# Patient Record
Sex: Female | Born: 1980 | Race: White | Hispanic: No | Marital: Married | Smoking: Never smoker
Health system: Southern US, Community
[De-identification: ages and names within clinical notes are randomized; demographics above are authoritative.]

## PROBLEM LIST (undated history)

## (undated) DIAGNOSIS — I7300 Raynaud's syndrome without gangrene: Secondary | ICD-10-CM

## (undated) DIAGNOSIS — G43909 Migraine, unspecified, not intractable, without status migrainosus: Secondary | ICD-10-CM

## (undated) HISTORY — DX: Migraine, unspecified, not intractable, without status migrainosus: G43.909

## (undated) HISTORY — PX: CHOLECYSTECTOMY: SHX55

## (undated) HISTORY — PX: CERVICAL SPINE SURGERY: SHX589

## (undated) HISTORY — DX: Raynaud's syndrome without gangrene: I73.00

## (undated) HISTORY — PX: OTHER SURGICAL HISTORY: SHX169

---

## 2017-04-04 ENCOUNTER — Other Ambulatory Visit: Payer: Self-pay | Admitting: Gastroenterology

## 2017-04-04 DIAGNOSIS — R1011 Right upper quadrant pain: Secondary | ICD-10-CM

## 2017-04-09 ENCOUNTER — Ambulatory Visit
Admission: RE | Admit: 2017-04-09 | Discharge: 2017-04-09 | Disposition: A | Discharge status: Home / Self Care | Payer: 59 | Source: Ambulatory Visit | Attending: Gastroenterology | Admitting: Gastroenterology

## 2017-04-09 DIAGNOSIS — R1011 Right upper quadrant pain: Secondary | ICD-10-CM

## 2020-04-25 ENCOUNTER — Encounter (INDEPENDENT_AMBULATORY_CARE_PROVIDER_SITE_OTHER): Payer: Self-pay | Admitting: *Deleted

## 2020-07-20 ENCOUNTER — Other Ambulatory Visit (INDEPENDENT_AMBULATORY_CARE_PROVIDER_SITE_OTHER): Payer: Self-pay

## 2020-07-20 ENCOUNTER — Ambulatory Visit (INDEPENDENT_AMBULATORY_CARE_PROVIDER_SITE_OTHER): Payer: Self-pay | Admitting: Gastroenterology

## 2020-07-20 ENCOUNTER — Encounter (INDEPENDENT_AMBULATORY_CARE_PROVIDER_SITE_OTHER): Payer: Self-pay | Admitting: Gastroenterology

## 2020-07-20 ENCOUNTER — Encounter (INDEPENDENT_AMBULATORY_CARE_PROVIDER_SITE_OTHER): Payer: Self-pay

## 2020-07-20 ENCOUNTER — Other Ambulatory Visit: Payer: Self-pay

## 2020-07-20 ENCOUNTER — Telehealth (INDEPENDENT_AMBULATORY_CARE_PROVIDER_SITE_OTHER): Payer: Self-pay

## 2020-07-20 DIAGNOSIS — R112 Nausea with vomiting, unspecified: Secondary | ICD-10-CM | POA: Insufficient documentation

## 2020-07-20 DIAGNOSIS — Z800 Family history of malignant neoplasm of digestive organs: Secondary | ICD-10-CM

## 2020-07-20 DIAGNOSIS — R110 Nausea: Secondary | ICD-10-CM

## 2020-07-20 DIAGNOSIS — R17 Unspecified jaundice: Secondary | ICD-10-CM | POA: Insufficient documentation

## 2020-07-20 DIAGNOSIS — G8929 Other chronic pain: Secondary | ICD-10-CM

## 2020-07-20 DIAGNOSIS — R1010 Upper abdominal pain, unspecified: Secondary | ICD-10-CM | POA: Insufficient documentation

## 2020-07-20 DIAGNOSIS — R1012 Left upper quadrant pain: Secondary | ICD-10-CM | POA: Insufficient documentation

## 2020-07-20 DIAGNOSIS — R1011 Right upper quadrant pain: Secondary | ICD-10-CM

## 2020-07-20 HISTORY — DX: Nausea with vomiting, unspecified: R11.2

## 2020-07-20 MED ORDER — HYOSCYAMINE SULFATE 0.125 MG PO TABS: 0.1250 mg | ORAL_TABLET | Freq: Four times a day (QID) | ORAL | 1 refills | Status: DC | PRN

## 2020-07-20 MED ORDER — PEG 3350-KCL-NA BICARB-NACL 420 G PO SOLR: 4000.0000 mL | ORAL | 0 refills | Status: DC

## 2020-07-20 NOTE — Patient Instructions (Addendum)
Schedule EGD and colonoscopy Start Levsin as needed for abdominal pain Can stop omeprazole and sucralfate for now until EGD is performed, if you have not felt these medications help Schedule MRCP Can take Zofran as needed for nausea/vomiting

## 2020-07-20 NOTE — H&P (View-Only) (Signed)
Amanda Mullen, M.D. Gastroenterology & Hepatology [facility] [address] [place], [place] [address] Primary Care Physician: Gaspar Skeeters, MD [address] [place] [address]  Referring MD: PCP  Chief Complaint: Left upper quadrant abdominal pain, bloating and chronic right upper quadrant abdominal pain.  History of Present Illness: Amanda Mullen is a 40 y.o. female with past medical history migraines, who presents for evaluation of left upper quadrant abdominal pain, bloating and chronic right upper quadrant abdominal pain.  Patient reports that she had pain in the RUQ for a year intermittently since 2014 and had multiple regurgitation episodes.  She is states that due to the symptoms she sought evaluation by gastroenterologist and the surgeon.  She reported that she had "dye test" possibly a HIDA scan and she was found to have decreased function. S he previously underwent a cholecystectomy in 2015 which improved her episode of right upper quadrant pain.  She reported she still has some occasional episodes of pain in this area but is less often than in the past.  She states that the regurgitation episodes actually resolved since then.She reports that she has not been taking acid reflux medication for multiple years after she had the cholecystectomy.   Patient now reports that in November 2021 she had new onset LUQ pain which she describes as severe episodes of stabbing pain in this area.  Has been evaluated by her PCP for this but reports that she has not felt much improvement of her symptoms.  Due to the severity and persistence of her symptoms, she went to the ER at [facility] on 04/19/2020, had CBC and CMP that were only remarkable for elevation of her total bilirubin up to 1.8.  Underwent a CT of the abdomen and pelvis with IV contrast which showed normal findings based on report (images are not available).  Lipase at that  time was 13.  Due to the location of her symptoms, the patient was told that symptoms were possibly related to an ulcer. She had blood workup and was told "the number was consistent with and ulcer". She was given diflucan and omeprazole but this did not improve the symptoms after taking it for a couple of weeks.   She went again to [facility] on 07/17/2020, a repeat CT scan of the abdomen and pelvis with IV contrast was unremarkable.  Labs showed normal CBC, CMP with mildly elevated ALT of 280 and AST was normal 30, total bilirubin was 1.1 alkaline phosphatase was 90, lactic acid was 1.7.  This time her lipase was 140, below 3 times the upper limit of normal.    She reports since the last time she was in the ER she reports she avoid fried and fatty food as it caused recurrent abdominal pain. She reports that she has been having significant abdominal pain. She feels nauseated frequently but does not vomit.  She has frequently bloating episodes, possibly two days of the month she is not bloated.  The symptoms have been chronic in nature.  The patient denies having any fever, chills, hematochezia, melena, hematemesis, diarrhea, jaundice, pruritus or weight loss.  Last EGD: 2015 - in [place], normal per patient Last colonoscopy 2015 - possibly ahd polyps per patient, but no report available.  FHx: neg for any gastrointestinal/liver disease, colon cancer at age 26-40s, possibly esophageal cancer Social: neg smoking, alcohol or illicit drug use Surgical: cholecystectomy, tubal ligation, c-section  Past Medical History: Past Medical History:  Diagnosis Date  .  Migraine     Past Surgical History: Past Surgical History:  Procedure Laterality Date  . CESAREAN SECTION    . CHOLECYSTECTOMY    . tubaligation     Tubes tied 2006    Family History: Family History  Problem Relation Age of Onset  . Ovarian cancer Mother   . Cervical cancer Mother   . Kidney disease Father   . Colon cancer  Father   . Diabetes Father   . Healthy Brother   . Healthy Brother     Social History: Social History   Tobacco Use  Smoking Status Never Smoker  Smokeless Tobacco Never Used   Social History   Substance and Sexual Activity  Alcohol Use Yes   Comment: occasionally   Social History   Substance and Sexual Activity  Drug Use Never    Allergies: Allergies  Allergen Reactions  . Penicillins Other (See Comments)    Patient unsure of  reaction  . Codeine     Caused pain in ribs.  Idelle Jo [Mupirocin] Rash  . Ciprofloxacin Rash    Medications: Current Outpatient Medications  Medication Sig Dispense Refill  . ondansetron (ZOFRAN-ODT) 4 MG disintegrating tablet Take 4 mg by mouth every 8 (eight) hours as needed for nausea or vomiting.    . pantoprazole (PROTONIX) 20 MG tablet Take 20 mg by mouth daily.    . propranolol (INDERAL) 20 MG tablet Take 20 mg by mouth as needed.    . rizatriptan (MAXALT) 10 MG tablet Take 10 mg by mouth as needed for migraine. May repeat in 2 hours if needed    . sucralfate (CARAFATE) 1 g tablet Take 1 g by mouth 4 (four) times daily -  with meals and at bedtime.     No current facility-administered medications for this visit.    Review of Systems: GENERAL: negative for malaise, night sweats HEENT: No changes in hearing or vision, no nose bleeds or other nasal problems. NECK: Negative for lumps, goiter, pain and significant neck swelling RESPIRATORY: Negative for cough, wheezing CARDIOVASCULAR: Negative for chest pain, leg swelling, palpitations, orthopnea GI: SEE HPI MUSCULOSKELETAL: Negative for joint pain or swelling, back pain, and muscle pain. SKIN: Negative for lesions, rash PSYCH: Negative for sleep disturbance, mood disorder and recent psychosocial stressors. HEMATOLOGY Negative for prolonged bleeding, bruising easily, and swollen nodes. ENDOCRINE: Negative for cold or heat intolerance, polyuria, polydipsia and goiter. NEURO:  negative for tremor, gait imbalance, syncope and seizures. The remainder of the review of systems is noncontributory.   Physical Exam: BP 113/81 (BP Location: Left Arm, Patient Position: Sitting, Cuff Size: Large)   Pulse 83   Temp (!) 97.4 F (36.3 C) (Oral)   Ht 5\' 1"  (1.549 m)   Wt 146 lb (66.2 kg)   BMI 27.59 kg/m  GENERAL: The patient is AO x3, in no acute distress. HEENT: Head is normocephalic and atraumatic. EOMI are intact. Mouth is well hydrated and without lesions. NECK: Supple. No masses LUNGS: Clear to auscultation. No presence of rhonchi/wheezing/rales. Adequate chest expansion HEART: RRR, normal s1 and s2. ABDOMEN: mildly tender to palpation in the epigastric and LUQ area, no guarding, no peritoneal signs, and nondistended. BS +. No masses. EXTREMITIES: Without any cyanosis, clubbing, rash, lesions or edema. NEUROLOGIC: AOx3, no focal motor deficit. SKIN: no jaundice, no rashes   Imaging/Labs: as above  I personally reviewed and interpreted the available labs, imaging and endoscopic files.  Impression and Plan: Amanda Mullen is a 39 y.o. female with  past medical history migraines, who presents for evaluation of left upper quadrant abdominal pain, bloating and chronic right upper quadrant abdominal pain. The patient has had chronic symptoms of RUQ pain which is now mild in severity but is now presenting significant pain in the LUQ. She has not presented red flag signs and has not presented any concerning imaging findings patient with 2 CT scans she had recently.  At this point, it would be important to perform an EGD with small bowel biopsies to evaluate Her symptoms as she has persistent with the discomfort.  Since the omeprazole and Carafate have not helped her she can stop these medications for now.  We will prescribe her Levsin as needed to improve her abdominal pain, as well as she will need to continue Zofran as needed to improve the nausea episodes.  The patient had  mild alterations of bilirubin, will obtain an MRCP to rule out any hepatobiliary obstruction, although I consider that her elevated bilirubin could be related to benign etiology such as Gilbert's syndrome.  Finally, she had a strong family history of colon cancer at early age, for which I will proceed with a colonoscopy, she should get colonoscopies every 5 years afterwards at the very least.  - Schedule EGD and colonoscopy - Start Levsin as needed for abdominal pain - Can stop omeprazole and sucralfate for now until EGD is performed, as she has not felt relief of symptoms while on it - Schedule MRCP - Can take Zofran as needed for nausea/vomiting  All questions were answered.      Amanda Blazing, MD Gastroenterology and Hepatology [facility]

## 2020-07-20 NOTE — Telephone Encounter (Signed)
Amanda Mullen, CMA  

## 2020-07-20 NOTE — Progress Notes (Signed)
[name], M.D. Gastroenterology & Hepatology [facility] [address] [place], [place] [address] Primary Care Physician: Gaspar Skeeters, MD [address] [place] [address]  Referring MD: PCP  Chief Complaint: Left upper quadrant abdominal pain, bloating and chronic right upper quadrant abdominal pain.  History of Present Illness: Amanda Mullen is a 40 y.o. female with past medical history migraines, who presents for evaluation of left upper quadrant abdominal pain, bloating and chronic right upper quadrant abdominal pain.  Patient reports that she had pain in the RUQ for a year intermittently since 2014 and had multiple regurgitation episodes.  She is states that due to the symptoms she sought evaluation by gastroenterologist and the surgeon.  She reported that she had "dye test" possibly a HIDA scan and she was found to have decreased function. S he previously underwent a cholecystectomy in 2015 which improved her episode of right upper quadrant pain.  She reported she still has some occasional episodes of pain in this area but is less often than in the past.  She states that the regurgitation episodes actually resolved since then.She reports that she has not been taking acid reflux medication for multiple years after she had the cholecystectomy.   Patient now reports that in November 2021 she had new onset LUQ pain which she describes as severe episodes of stabbing pain in this area.  Has been evaluated by her PCP for this but reports that she has not felt much improvement of her symptoms.  Due to the severity and persistence of her symptoms, she went to the ER at [facility] on 04/19/2020, had CBC and CMP that were only remarkable for elevation of her total bilirubin up to 1.8.  Underwent a CT of the abdomen and pelvis with IV contrast which showed normal findings based on report (images are not available).  Lipase at that  time was 13.  Due to the location of her symptoms, the patient was told that symptoms were possibly related to an ulcer. She had blood workup and was told "the number was consistent with and ulcer". She was given diflucan and omeprazole but this did not improve the symptoms after taking it for a couple of weeks.   She went again to [facility] on 07/17/2020, a repeat CT scan of the abdomen and pelvis with IV contrast was unremarkable.  Labs showed normal CBC, CMP with mildly elevated ALT of 280 and AST was normal 30, total bilirubin was 1.1 alkaline phosphatase was 90, lactic acid was 1.7.  This time her lipase was 140, below 3 times the upper limit of normal.    She reports since the last time she was in the ER she reports she avoid fried and fatty food as it caused recurrent abdominal pain. She reports that she has been having significant abdominal pain. She feels nauseated frequently but does not vomit.  She has frequently bloating episodes, possibly two days of the month she is not bloated.  The symptoms have been chronic in nature.  The patient denies having any fever, chills, hematochezia, melena, hematemesis, diarrhea, jaundice, pruritus or weight loss.  Last EGD: 2015 - in [place], normal per patient Last colonoscopy 2015 - possibly ahd polyps per patient, but no report available.  FHx: neg for any gastrointestinal/liver disease, colon cancer at age 26-40s, possibly esophageal cancer Social: neg smoking, alcohol or illicit drug use Surgical: cholecystectomy, tubal ligation, c-section  Past Medical History: Past Medical History:  Diagnosis Date  .  Migraine     Past Surgical History: Past Surgical History:  Procedure Laterality Date  . CESAREAN SECTION    . CHOLECYSTECTOMY    . tubaligation     Tubes tied 2006    Family History: Family History  Problem Relation Age of Onset  . Ovarian cancer Mother   . Cervical cancer Mother   . Kidney disease Father   . Colon cancer  Father   . Diabetes Father   . Healthy Brother   . Healthy Brother     Social History: Social History   Tobacco Use  Smoking Status Never Smoker  Smokeless Tobacco Never Used   Social History   Substance and Sexual Activity  Alcohol Use Yes   Comment: occasionally   Social History   Substance and Sexual Activity  Drug Use Never    Allergies: Allergies  Allergen Reactions  . Penicillins Other (See Comments)    Patient unsure of  reaction  . Codeine     Caused pain in ribs.  . Bactroban [Mupirocin] Rash  . Ciprofloxacin Rash    Medications: Current Outpatient Medications  Medication Sig Dispense Refill  . ondansetron (ZOFRAN-ODT) 4 MG disintegrating tablet Take 4 mg by mouth every 8 (eight) hours as needed for nausea or vomiting.    . pantoprazole (PROTONIX) 20 MG tablet Take 20 mg by mouth daily.    . propranolol (INDERAL) 20 MG tablet Take 20 mg by mouth as needed.    . rizatriptan (MAXALT) 10 MG tablet Take 10 mg by mouth as needed for migraine. May repeat in 2 hours if needed    . sucralfate (CARAFATE) 1 g tablet Take 1 g by mouth 4 (four) times daily -  with meals and at bedtime.     No current facility-administered medications for this visit.    Review of Systems: GENERAL: negative for malaise, night sweats HEENT: No changes in hearing or vision, no nose bleeds or other nasal problems. NECK: Negative for lumps, goiter, pain and significant neck swelling RESPIRATORY: Negative for cough, wheezing CARDIOVASCULAR: Negative for chest pain, leg swelling, palpitations, orthopnea GI: SEE HPI MUSCULOSKELETAL: Negative for joint pain or swelling, back pain, and muscle pain. SKIN: Negative for lesions, rash PSYCH: Negative for sleep disturbance, mood disorder and recent psychosocial stressors. HEMATOLOGY Negative for prolonged bleeding, bruising easily, and swollen nodes. ENDOCRINE: Negative for cold or heat intolerance, polyuria, polydipsia and goiter. NEURO:  negative for tremor, gait imbalance, syncope and seizures. The remainder of the review of systems is noncontributory.   Physical Exam: BP 113/81 (BP Location: Left Arm, Patient Position: Sitting, Cuff Size: Large)   Pulse 83   Temp (!) 97.4 F (36.3 C) (Oral)   Ht 5' 1" (1.549 m)   Wt 146 lb (66.2 kg)   BMI 27.59 kg/m  GENERAL: The patient is AO x3, in no acute distress. HEENT: Head is normocephalic and atraumatic. EOMI are intact. Mouth is well hydrated and without lesions. NECK: Supple. No masses LUNGS: Clear to auscultation. No presence of rhonchi/wheezing/rales. Adequate chest expansion HEART: RRR, normal s1 and s2. ABDOMEN: mildly tender to palpation in the epigastric and LUQ area, no guarding, no peritoneal signs, and nondistended. BS +. No masses. EXTREMITIES: Without any cyanosis, clubbing, rash, lesions or edema. NEUROLOGIC: AOx3, no focal motor deficit. SKIN: no jaundice, no rashes   Imaging/Labs: as above  I personally reviewed and interpreted the available labs, imaging and endoscopic files.  Impression and Plan: Amanda Mullen is a 39 y.o. female with   past medical history migraines, who presents for evaluation of left upper quadrant abdominal pain, bloating and chronic right upper quadrant abdominal pain. The patient has had chronic symptoms of RUQ pain which is now mild in severity but is now presenting significant pain in the LUQ. She has not presented red flag signs and has not presented any concerning imaging findings patient with 2 CT scans she had recently.  At this point, it would be important to perform an EGD with small bowel biopsies to evaluate Her symptoms as she has persistent with the discomfort.  Since the omeprazole and Carafate have not helped her she can stop these medications for now.  We will prescribe her Levsin as needed to improve her abdominal pain, as well as she will need to continue Zofran as needed to improve the nausea episodes.  The patient had  mild alterations of bilirubin, will obtain an MRCP to rule out any hepatobiliary obstruction, although I consider that her elevated bilirubin could be related to benign etiology such as Gilbert's syndrome.  Finally, she had a strong family history of colon cancer at early age, for which I will proceed with a colonoscopy, she should get colonoscopies every 5 years afterwards at the very least.  - Schedule EGD and colonoscopy - Start Levsin as needed for abdominal pain - Can stop omeprazole and sucralfate for now until EGD is performed, as she has not felt relief of symptoms while on it - Schedule MRCP - Can take Zofran as needed for nausea/vomiting  All questions were answered.      [name], MD Gastroenterology and Hepatology [facility]

## 2020-07-24 ENCOUNTER — Encounter (HOSPITAL_COMMUNITY): Payer: Self-pay | Admitting: Gastroenterology

## 2020-07-24 ENCOUNTER — Other Ambulatory Visit (HOSPITAL_COMMUNITY): Payer: BC Managed Care – PPO

## 2020-07-25 ENCOUNTER — Other Ambulatory Visit (HOSPITAL_COMMUNITY)
Admission: RE | Admit: 2020-07-25 | Discharge: 2020-07-25 | Disposition: A | Discharge status: Home / Self Care | Payer: BC Managed Care – PPO | Source: Ambulatory Visit | Attending: Gastroenterology | Admitting: Gastroenterology

## 2020-07-25 ENCOUNTER — Other Ambulatory Visit: Payer: Self-pay

## 2020-07-25 DIAGNOSIS — Z20822 Contact with and (suspected) exposure to covid-19: Secondary | ICD-10-CM | POA: Insufficient documentation

## 2020-07-25 DIAGNOSIS — Z9049 Acquired absence of other specified parts of digestive tract: Secondary | ICD-10-CM | POA: Diagnosis not present

## 2020-07-25 DIAGNOSIS — Z01812 Encounter for preprocedural laboratory examination: Secondary | ICD-10-CM | POA: Insufficient documentation

## 2020-07-25 DIAGNOSIS — Z800 Family history of malignant neoplasm of digestive organs: Secondary | ICD-10-CM | POA: Diagnosis not present

## 2020-07-25 DIAGNOSIS — Z885 Allergy status to narcotic agent status: Secondary | ICD-10-CM | POA: Diagnosis not present

## 2020-07-25 DIAGNOSIS — R1012 Left upper quadrant pain: Secondary | ICD-10-CM | POA: Diagnosis not present

## 2020-07-25 DIAGNOSIS — Z880 Allergy status to penicillin: Secondary | ICD-10-CM | POA: Diagnosis not present

## 2020-07-25 DIAGNOSIS — Z1211 Encounter for screening for malignant neoplasm of colon: Secondary | ICD-10-CM | POA: Diagnosis present

## 2020-07-25 DIAGNOSIS — Z881 Allergy status to other antibiotic agents status: Secondary | ICD-10-CM | POA: Diagnosis not present

## 2020-07-25 DIAGNOSIS — K2950 Unspecified chronic gastritis without bleeding: Secondary | ICD-10-CM | POA: Diagnosis not present

## 2020-07-25 LAB — SARS CORONAVIRUS 2 (TAT 6-24 HRS): SARS Coronavirus 2: NEGATIVE

## 2020-07-25 LAB — PREGNANCY, URINE: Preg Test, Ur: NEGATIVE

## 2020-07-26 ENCOUNTER — Ambulatory Visit (HOSPITAL_COMMUNITY): Payer: BC Managed Care – PPO | Admitting: Anesthesiology

## 2020-07-26 ENCOUNTER — Encounter (INDEPENDENT_AMBULATORY_CARE_PROVIDER_SITE_OTHER): Payer: Self-pay | Admitting: *Deleted

## 2020-07-26 ENCOUNTER — Encounter (HOSPITAL_COMMUNITY)
Admission: RE | Disposition: A | Discharge status: Home / Self Care | Payer: Self-pay | Source: Home / Self Care | Attending: Gastroenterology

## 2020-07-26 ENCOUNTER — Ambulatory Visit (HOSPITAL_COMMUNITY)
Admission: RE | Admit: 2020-07-26 | Discharge: 2020-07-26 | Disposition: A | Discharge status: Home / Self Care | Payer: BC Managed Care – PPO | Attending: Gastroenterology | Admitting: Gastroenterology

## 2020-07-26 ENCOUNTER — Encounter (HOSPITAL_COMMUNITY): Payer: Self-pay | Admitting: Gastroenterology

## 2020-07-26 ENCOUNTER — Other Ambulatory Visit: Payer: Self-pay

## 2020-07-26 DIAGNOSIS — Z881 Allergy status to other antibiotic agents status: Secondary | ICD-10-CM | POA: Insufficient documentation

## 2020-07-26 DIAGNOSIS — K2950 Unspecified chronic gastritis without bleeding: Secondary | ICD-10-CM | POA: Insufficient documentation

## 2020-07-26 DIAGNOSIS — Z9049 Acquired absence of other specified parts of digestive tract: Secondary | ICD-10-CM | POA: Insufficient documentation

## 2020-07-26 DIAGNOSIS — Z20822 Contact with and (suspected) exposure to covid-19: Secondary | ICD-10-CM | POA: Insufficient documentation

## 2020-07-26 DIAGNOSIS — Z1211 Encounter for screening for malignant neoplasm of colon: Secondary | ICD-10-CM | POA: Diagnosis not present

## 2020-07-26 DIAGNOSIS — Z885 Allergy status to narcotic agent status: Secondary | ICD-10-CM | POA: Insufficient documentation

## 2020-07-26 DIAGNOSIS — Z880 Allergy status to penicillin: Secondary | ICD-10-CM | POA: Insufficient documentation

## 2020-07-26 DIAGNOSIS — Z800 Family history of malignant neoplasm of digestive organs: Secondary | ICD-10-CM | POA: Insufficient documentation

## 2020-07-26 DIAGNOSIS — R1012 Left upper quadrant pain: Secondary | ICD-10-CM | POA: Insufficient documentation

## 2020-07-26 HISTORY — PX: ESOPHAGOGASTRODUODENOSCOPY (EGD) WITH PROPOFOL: SHX5813

## 2020-07-26 HISTORY — PX: BIOPSY: SHX5522

## 2020-07-26 HISTORY — PX: COLONOSCOPY WITH PROPOFOL: SHX5780

## 2020-07-26 LAB — HM COLONOSCOPY

## 2020-07-26 SURGERY — ESOPHAGOGASTRODUODENOSCOPY (EGD) WITH PROPOFOL
Anesthesia: General

## 2020-07-26 MED ORDER — PROPOFOL 10 MG/ML IV BOLUS
INTRAVENOUS | Status: DC | PRN
  Administered 2020-07-26: 150 mg via INTRAVENOUS
  Administered 2020-07-26: 20 mg via INTRAVENOUS

## 2020-07-26 MED ORDER — PROPOFOL 500 MG/50ML IV EMUL
INTRAVENOUS | Status: DC | PRN
  Administered 2020-07-26: 200 ug/kg/min via INTRAVENOUS

## 2020-07-26 MED ORDER — STERILE WATER FOR IRRIGATION IR SOLN
Status: DC | PRN
  Administered 2020-07-26: 100 mL

## 2020-07-26 MED ORDER — LIDOCAINE 2% (20 MG/ML) 5 ML SYRINGE
INTRAMUSCULAR | Status: DC | PRN
  Administered 2020-07-26: 30 mg via INTRAVENOUS

## 2020-07-26 MED ORDER — LACTATED RINGERS IV SOLN
INTRAVENOUS | Status: DC
  Administered 2020-07-26: 1000 mL via INTRAVENOUS

## 2020-07-26 NOTE — Anesthesia Postprocedure Evaluation (Signed)
Anesthesia Post Note  Patient: Chaney Born  Procedure(s) Performed: ESOPHAGOGASTRODUODENOSCOPY (EGD) WITH PROPOFOL (N/A ) COLONOSCOPY WITH PROPOFOL (N/A ) BIOPSY  Patient location during evaluation: Endoscopy Anesthesia Type: General Level of consciousness: awake and alert and oriented Pain management: pain level controlled Vital Signs Assessment: post-procedure vital signs reviewed and stable Respiratory status: spontaneous breathing and respiratory function stable Cardiovascular status: stable Postop Assessment: no apparent nausea or vomiting Anesthetic complications: no   No complications documented.   Last Vitals:  Vitals:   07/26/20 1249 07/26/20 1255  BP: (!) 84/51 (!) 98/59  Pulse: 76 79  Resp: 19 (!) 21  Temp: 36.5 C   SpO2: 98% 98%    Last Pain:  Vitals:   07/26/20 1255  TempSrc:   PainSc: 0-No pain                 Ricki Vanhandel C Worthington Cruzan

## 2020-07-26 NOTE — Interval H&P Note (Signed)
History and Physical Interval Note:  07/26/2020 10:59 AM Amanda Mullen is a 40 y.o. female with past medical history migraines, who presents for evaluation of left upper quadrant abdominal pain, bloating, chronic right upper quadrant abdominal pain and family history of colorectal cancer. Patient states that she is still having some occasional episodes of left lower quadrant pain but she has avoided eating too much as this can lead to worsening symptoms.  Has felt slightly nauseated.  Denies any melena, hematochezia, vomiting, fever, chills.  Last colonoscopy was performed in 2015.  Had some polyps at that time.  Family history is remarkable for father with colon cancer in his 30s and 76s.  HR 64 RR 14 O2 sat 98% BP 121/81 GENERAL: The patient is AO x3, in no acute distress. HEENT: Head is normocephalic and atraumatic. EOMI are intact. Mouth is well hydrated and without lesions. NECK: Supple. No masses LUNGS: Clear to auscultation. No presence of rhonchi/wheezing/rales. Adequate chest expansion HEART: RRR, normal s1 and s2. ABDOMEN: mildly tender in upper abdomen, no guarding, no peritoneal signs, and nondistended. BS +. No masses. EXTREMITIES: Without any cyanosis, clubbing, rash, lesions or edema. NEUROLOGIC: AOx3, no focal motor deficit. SKIN: no jaundice, no rashes   Amanda Mullen  has presented today for surgery, with the diagnosis of Family Hx of colon ca Left Upper Quad abdominal Pain.  The various methods of treatment have been discussed with the patient and family. After consideration of risks, benefits and other options for treatment, the patient has consented to  Procedure(s) with comments: ESOPHAGOGASTRODUODENOSCOPY (EGD) WITH PROPOFOL (N/A) - PM COLONOSCOPY WITH PROPOFOL (N/A) as a surgical intervention.  The patient's history has been reviewed, patient examined, no change in status, stable for surgery.  I have reviewed the patient's chart and labs.  Questions were answered to the  patient's satisfaction.     [name] Mayorga

## 2020-07-26 NOTE — Op Note (Signed)
[facility] Patient Name: Amanda Mullen Procedure Date: 07/26/2020 12:23 PM MRN: [id number] Date of Birth: [birth year] Attending MD: [name] ,  CSN: [id number] Age: 40 Admit Type: Outpatient Procedure:                Colonoscopy Indications:              Screening in patient at increased risk: Family                            history of 1st-degree relative with colorectal                            cancer before age 66 years Providers:                [name], Edrick Kins, RN, Crystal Page Referring MD:              Medicines:                Monitored Anesthesia Care Complications:            No immediate complications. Estimated Blood Loss:     Estimated blood loss: none. Procedure:                Pre-Anesthesia Assessment:                           - Prior to the procedure, a History and Physical                            was performed, and patient medications, allergies                            and sensitivities were reviewed. The patient's                            tolerance of previous anesthesia was reviewed.                           - The risks and benefits of the procedure and the                            sedation options and risks were discussed with the                            patient. All questions were answered and informed                            consent was obtained.                           - ASA Grade Assessment: II - A patient with mild                            systemic disease.                           After obtaining informed consent, the  colonoscope                            was passed under direct vision. Throughout the                            procedure, the patient's blood pressure, pulse, and                            oxygen saturations were monitored continuously. The                            [id number]-HQ190L ([id number]) scope was introduced through                            the anus and advanced to the the cecum,  identified                            by appendiceal orifice and ileocecal valve. The                            colonoscopy was performed without difficulty. The                            patient tolerated the procedure well. The quality                            of the bowel preparation was good. Scope In: 12:25:35 PM Scope Out: 12:46:36 PM Scope Withdrawal Time: 0 hours 14 minutes 14 seconds  Total Procedure Duration: 0 hours 21 minutes 1 second  Findings:      The perianal and digital rectal examinations were normal.      The terminal ileum appeared normal.      The colon (entire examined portion) appeared normal.      The retroflexed view of the distal rectum and anal verge was normal and       showed no anal or rectal abnormalities. Impression:               - The examined portion of the ileum was normal.                           - The entire examined colon is normal.                           - The distal rectum and anal verge are normal on                            retroflexion view.                           - No specimens collected. Moderate Sedation:      Per Anesthesia Care Recommendation:           - Discharge patient to home (ambulatory).                           -  Resume previous diet.                           - Repeat colonoscopy in 5 years for screening                            purposes. Procedure Code(s):        --- Professional ---                           Y1950, Colorectal cancer screening; colonoscopy on                            individual at high risk Diagnosis Code(s):        --- Professional ---                           Z80.0, Family history of malignant neoplasm of                            digestive organs CPT copyright 2019 American Medical Association. All rights reserved. The codes documented in this report are preliminary and upon coder review may  be revised to meet current compliance requirements. [name], MD [name],   07/26/2020 12:52:07 PM This report has been signed electronically. Number of Addenda: 0

## 2020-07-26 NOTE — Discharge Instructions (Signed)
You are being discharged to home. Start a low FODMAP diet - can ask for a copy at the GI clinic  We are waiting for your pathology results.  Start Levsin for abdominal pain episodes as needed Your physician has recommended a repeat colonoscopy in five years for screening purposes.       Colonoscopy, Adult, Care After This sheet gives you information about how to care for yourself after your procedure. Your doctor may also give you more specific instructions. If you have problems or questions, call your doctor. What can I expect after the procedure? After the procedure, it is common to have:  A small amount of blood in your poop (stool) for 24 hours.  Some gas.  Mild cramping or bloating in your belly (abdomen). Follow these instructions at home: Eating and drinking  Drink enough fluid to keep your pee (urine) pale yellow.  Follow instructions from your doctor about what you cannot eat or drink.  Return to your normal diet as told by your doctor. Avoid heavy or fried foods that are hard to digest.   Activity  Rest as told by your doctor.  Do not sit for a long time without moving. Get up to take short walks every 1-2 hours. This is important. Ask for help if you feel weak or unsteady.  Return to your normal activities as told by your doctor. Ask your doctor what activities are safe for you. To help cramping and bloating:  Try walking around.  Put heat on your belly as told by your doctor. Use the heat source that your doctor recommends, such as a moist heat pack or a heating pad. ? Put a towel between your skin and the heat source. ? Leave the heat on for 20-30 minutes. ? Remove the heat if your skin turns bright red. This is very important if you are unable to feel pain, heat, or cold. You may have a greater risk of getting burned.   General instructions  If you were given a medicine to help you relax (sedative) during your procedure, it can affect you for many hours. Do  not drive or use machinery until your doctor says that it is safe.  For the first 24 hours after the procedure: ? Do not sign important documents. ? Do not drink alcohol. ? Do your daily activities more slowly than normal. ? Eat foods that are soft and easy to digest.  Take over-the-counter or prescription medicines only as told by your doctor.  Keep all follow-up visits as told by your doctor. This is important. Contact a doctor if:  You have blood in your poop 2-3 days after the procedure. Get help right away if:  You have more than a small amount of blood in your poop.  You see large clumps of tissue (blood clots) in your poop.  Your belly is swollen.  You feel like you may vomit (nauseous).  You vomit.  You have a fever.  You have belly pain that gets worse, and medicine does not help your pain. Summary  After the procedure, it is common to have a small amount of blood in your poop. You may also have mild cramping and bloating in your belly.  If you were given a medicine to help you relax (sedative) during your procedure, it can affect you for many hours. Do not drive or use machinery until your doctor says that it is safe.  Get help right away if you have a lot of  blood in your poop, feel like you may vomit, have a fever, or have more belly pain. This information is not intended to replace advice given to you by your health care provider. Make sure you discuss any questions you have with your health care provider. Document Revised: 02/05/2019 Document Reviewed: 10/26/2018 Elsevier Patient Education  2021 Elsevier Inc.     Low-FODMAP Eating Plan  FODMAP stands for fermentable oligosaccharides, disaccharides, monosaccharides, and polyols. These are sugars that are hard for some people to digest. A low-FODMAP eating plan may help some people who have irritable bowel syndrome (IBS) and certain other bowel (intestinal) diseases to manage their symptoms. This meal plan  can be complicated to follow. Work with a diet and nutrition specialist (dietitian) to make a low-FODMAP eating plan that is right for you. A dietitian can help make sure that you get enough nutrition from this diet. What are tips for following this plan? Reading food labels  Check labels for hidden FODMAPs such as: ? High-fructose syrup. ? Honey. ? Agave. ? Natural fruit flavors. ? Onion or garlic powder.  Choose low-FODMAP foods that contain 3-4 grams of fiber per serving.  Check food labels for serving sizes. Eat only one serving at a time to make sure FODMAP levels stay low. Shopping  Shop with a list of foods that are recommended on this diet and make a meal plan. Meal planning  Follow a low-FODMAP eating plan for up to 6 weeks, or as told by your health care provider or dietitian.  To follow the eating plan: 1. Eliminate high-FODMAP foods from your diet completely. Choose only low-FODMAP foods to eat. You will do this for 2-6 weeks. 2. Gradually reintroduce high-FODMAP foods into your diet one at a time. Most people should wait a few days before introducing the next new high-FODMAP food into their meal plan. Your dietitian can recommend how quickly you may reintroduce foods. 3. Keep a daily record of what and how much you eat and drink. Make note of any symptoms that you have after eating. 4. Review your daily record with a dietitian regularly to identify which foods you can eat and which foods you should avoid. General tips  Drink enough fluid each day to keep your urine pale yellow.  Avoid processed foods. These often have added sugar and may be high in FODMAPs.  Avoid most dairy products, whole grains, and sweeteners.  Work with a dietitian to make sure you get enough fiber in your diet.  Avoid high FODMAP foods at meals to manage symptoms. Recommended foods Fruits Bananas, oranges, tangerines, lemons, limes, blueberries, raspberries, strawberries, grapes, cantaloupe,  honeydew melon, kiwi, papaya, passion fruit, and pineapple. Limited amounts of dried cranberries, banana chips, and shredded coconut. Vegetables Eggplant, zucchini, cucumber, peppers, green beans, bean sprouts, lettuce, arugula, kale, Swiss chard, spinach, collard greens, bok choy, summer squash, potato, and tomato. Limited amounts of corn, carrot, and sweet potato. Green parts of scallions. Grains Gluten-free grains, such as rice, oats, buckwheat, quinoa, corn, polenta, and millet. Gluten-free pasta, bread, or cereal. Rice noodles. Corn tortillas. Meats and other proteins Unseasoned beef, pork, poultry, or fish. Eggs. Tomasa Blase. Tofu (firm) and tempeh. Limited amounts of nuts and seeds, such as almonds, walnuts, [place] nuts, pecans, peanuts, nut butters, pumpkin seeds, chia seeds, and sunflower seeds. Dairy Lactose-free milk, yogurt, and kefir. Lactose-free cottage cheese and ice cream. Non-dairy milks, such as almond, coconut, hemp, and rice milk. Non-dairy yogurt. Limited amounts of goat cheese, brie, mozzarella, parmesan, swiss,  and other hard cheeses. Fats and oils Butter-free spreads. Vegetable oils, such as olive, canola, and sunflower oil. Seasoning and other foods Artificial sweeteners with names that do not end in "ol," such as aspartame, saccharine, and stevia. Maple syrup, white table sugar, raw sugar, brown sugar, and molasses. Mayonnaise, soy sauce, and tamari. Fresh basil, coriander, parsley, rosemary, and thyme. Beverages Water and mineral water. Sugar-sweetened soft drinks. Small amounts of orange juice or cranberry juice. Black and green tea. Most dry wines. Coffee. The items listed above may not be a complete list of foods and beverages you can eat. Contact a dietitian for more information. Foods to avoid Fruits Fresh, dried, and juiced forms of apple, pear, watermelon, peach, plum, cherries, apricots, blackberries, boysenberries, figs, nectarines, and mango.  Avocado. Vegetables Chicory root, artichoke, asparagus, cabbage, snow peas, Brussels sprouts, broccoli, sugar snap peas, mushrooms, celery, and cauliflower. Onions, garlic, leeks, and the white part of scallions. Grains Wheat, including kamut, durum, and semolina. Barley and bulgur. Couscous. Wheat-based cereals. Wheat noodles, bread, crackers, and pastries. Meats and other proteins Fried or fatty meat. Sausage. Cashews and pistachios. Soybeans, baked beans, black beans, chickpeas, kidney beans, fava beans, navy beans, lentils, black-eyed peas, and split peas. Dairy Milk, yogurt, ice cream, and soft cheese. Cream and sour cream. Milk-based sauces. Custard. Buttermilk. Soy milk. Seasoning and other foods Any sugar-free gum or candy. Foods that contain artificial sweeteners such as sorbitol, mannitol, isomalt, or xylitol. Foods that contain honey, high-fructose corn syrup, or agave. Bouillon, vegetable stock, beef stock, and chicken stock. Garlic and onion powder. Condiments made with onion, such as hummus, chutney, pickles, relish, salad dressing, and salsa. Tomato paste. Beverages Chicory-based drinks. Coffee substitutes. Chamomile tea. Fennel tea. Sweet or fortified wines such as port or sherry. Diet soft drinks made with isomalt, mannitol, maltitol, sorbitol, or xylitol. Apple, pear, and mango juice. Juices with high-fructose corn syrup. The items listed above may not be a complete list of foods and beverages you should avoid. Contact a dietitian for more information. Summary  FODMAP stands for fermentable oligosaccharides, disaccharides, monosaccharides, and polyols. These are sugars that are hard for some people to digest.  A low-FODMAP eating plan is a short-term diet that helps to ease symptoms of certain bowel diseases.  The eating plan usually lasts up to 6 weeks. After that, high-FODMAP foods are reintroduced gradually and one at a time. This can help you find out which foods may be  causing symptoms.  A low-FODMAP eating plan can be complicated. It is best to work with a dietitian who has experience with this type of plan. This information is not intended to replace advice given to you by your health care provider. Make sure you discuss any questions you have with your health care provider. Document Revised: 08/19/2019 Document Reviewed: 08/19/2019 Elsevier Patient Education  2021 Elsevier Inc.      Monitored Anesthesia Care, Care After This sheet gives you information about how to care for yourself after your procedure. Your health care provider may also give you more specific instructions. If you have problems or questions, contact your health care provider. What can I expect after the procedure? After the procedure, it is common to have:  Tiredness.  Forgetfulness about what happened after the procedure.  Impaired judgment for important decisions.  Nausea or vomiting.  Some difficulty with balance. Follow these instructions at home: For the time period you were told by your health care provider:  Rest as needed.  Do not participate in  activities where you could fall or become injured.  Do not drive or use machinery.  Do not drink alcohol.  Do not take sleeping pills or medicines that cause drowsiness.  Do not make important decisions or sign legal documents.  Do not take care of children on your own.      Eating and drinking  Follow the diet that is recommended by your health care provider.  Drink enough fluid to keep your urine pale yellow.  If you vomit: ? Drink water, juice, or soup when you can drink without vomiting. ? Make sure you have little or no nausea before eating solid foods. General instructions  Have a responsible adult stay with you for the time you are told. It is important to have someone help care for you until you are awake and alert.  Take over-the-counter and prescription medicines only as told by your health care  provider.  If you have sleep apnea, surgery and certain medicines can increase your risk for breathing problems. Follow instructions from your health care provider about wearing your sleep device: ? Anytime you are sleeping, including during daytime naps. ? While taking prescription pain medicines, sleeping medicines, or medicines that make you drowsy.  Avoid smoking.  Keep all follow-up visits as told by your health care provider. This is important. Contact a health care provider if:  You keep feeling nauseous or you keep vomiting.  You feel light-headed.  You are still sleepy or having trouble with balance after 24 hours.  You develop a rash.  You have a fever.  You have redness or swelling around the IV site. Get help right away if:  You have trouble breathing.  You have new-onset confusion at home. Summary  For several hours after your procedure, you may feel tired. You may also be forgetful and have poor judgment.  Have a responsible adult stay with you for the time you are told. It is important to have someone help care for you until you are awake and alert.  Rest as told. Do not drive or operate machinery. Do not drink alcohol or take sleeping pills.  Get help right away if you have trouble breathing, or if you suddenly become confused. This information is not intended to replace advice given to you by your health care provider. Make sure you discuss any questions you have with your health care provider. Document Revised: 12/16/2019 Document Reviewed: 03/04/2019 Elsevier Patient Education  2021 [employer].

## 2020-07-26 NOTE — Op Note (Signed)
[facility] Patient Name: Amanda Mullen Procedure Date: 07/26/2020 12:09 PM MRN: [id number] Date of Birth: 03/30/1981 Attending MD: [name] ,  CSN: [id number] Age: 40 Admit Type: Outpatient Procedure:                Upper GI endoscopy Indications:              Abdominal pain in the left upper quadrant Providers:                [name], Crystal Page, Edrick Kins, RN Referring MD:              Medicines:                Monitored Anesthesia Care Complications:            No immediate complications. Estimated Blood Loss:     Estimated blood loss: none. Procedure:                Pre-Anesthesia Assessment:                           - Prior to the procedure, a History and Physical                            was performed, and patient medications, allergies                            and sensitivities were reviewed. The patient's                            tolerance of previous anesthesia was reviewed.                           - The risks and benefits of the procedure and the                            sedation options and risks were discussed with the                            patient. All questions were answered and informed                            consent was obtained.                           - ASA Grade Assessment: II - A patient with mild                            systemic disease.                           After obtaining informed consent, the endoscope was                            passed under direct vision. Throughout the                            procedure,  the patient's blood pressure, pulse, and                            oxygen saturations were monitored continuously. The                            [id number]-[id number] ([id number]) scope was introduced through the                            mouth, and advanced to the second part of duodenum.                            The upper GI endoscopy was accomplished without                            difficulty. The  patient tolerated the procedure                            well. Scope In: 12:13:38 PM Scope Out: 12:21:00 PM Total Procedure Duration: 0 hours 7 minutes 22 seconds  Findings:      The examined esophagus was normal.      The entire examined stomach was normal. Biopsies were taken with a cold       forceps for Helicobacter pylori testing.      The examined duodenum was normal. Biopsies were taken with a cold       forceps for histology. Impression:               - Normal esophagus.                           - Normal stomach. Biopsied.                           - Normal examined duodenum. Biopsied. Moderate Sedation:      Per Anesthesia Care Recommendation:           - Discharge patient to home (ambulatory).                           - Start a low FODMAP diet - can ask for a copy at                            the GI clinic                           - Await pathology results.                           - Start Levsin for abdominal pain episodes as needed Procedure Code(s):        --- Professional ---                           [phone], Esophagogastroduodenoscopy, flexible,                            transoral; with biopsy, single or  multiple Diagnosis Code(s):        --- Professional ---                           R10.12, Left upper quadrant pain CPT copyright 2019 American Medical Association. All rights reserved. The codes documented in this report are preliminary and upon coder review may  be revised to meet current compliance requirements. [name], MD [name],  07/26/2020 12:49:39 PM This report has been signed electronically. Number of Addenda: 0

## 2020-07-26 NOTE — Anesthesia Preprocedure Evaluation (Addendum)
Anesthesia Evaluation  Patient identified by MRN, date of birth, ID band Patient awake    Reviewed: Allergy & Precautions, NPO status , Patient's Chart, lab work & pertinent test results  History of Anesthesia Complications Negative for: history of anesthetic complications  Airway Mallampati: I  TM Distance: >3 FB Neck ROM: Full   Comment: cervical spine sx, neck pain  Dental  (+) Dental Advisory Given, Teeth Intact   Pulmonary neg pulmonary ROS,    Pulmonary exam normal breath sounds clear to auscultation       Cardiovascular Exercise Tolerance: Good Normal cardiovascular exam Rhythm:Regular Rate:Normal     Neuro/Psych  Headaches, negative psych ROS   GI/Hepatic GERD  Medicated,(+)     substance abuse  alcohol use,   Endo/Other  negative endocrine ROS  Renal/GU negative Renal ROS     Musculoskeletal  (+) Arthritis  (herniated disc, cervical spine sx, neck pain),   Abdominal   Peds  Hematology negative hematology ROS (+)   Anesthesia Other Findings Hoarseness after talking loud   Reproductive/Obstetrics negative OB ROS                           Anesthesia Physical Anesthesia Plan  ASA: I  Anesthesia Plan: General   Post-op Pain Management:    Induction: Intravenous  PONV Risk Score and Plan: Propofol infusion  Airway Management Planned: Nasal Cannula and Natural Airway  Additional Equipment:   Intra-op Plan:   Post-operative Plan:   Informed Consent: I have reviewed the patients History and Physical, chart, labs and discussed the procedure including the risks, benefits and alternatives for the proposed anesthesia with the patient or authorized representative who has indicated his/her understanding and acceptance.     Dental advisory given  Plan Discussed with: CRNA and Surgeon  Anesthesia Plan Comments:        Anesthesia Quick Evaluation

## 2020-07-26 NOTE — Transfer of Care (Signed)
Immediate Anesthesia Transfer of Care Note  Patient: Amanda Mullen  Procedure(s) Performed: ESOPHAGOGASTRODUODENOSCOPY (EGD) WITH PROPOFOL (N/A ) COLONOSCOPY WITH PROPOFOL (N/A ) BIOPSY  Patient Location: Endoscopy Unit  Anesthesia Type:MAC  Level of Consciousness: sedated, patient cooperative and responds to stimulation  Airway & Oxygen Therapy: Patient Spontanous Breathing and Patient connected to nasal cannula oxygen  Post-op Assessment: Report given to RN, Post -op Vital signs reviewed and stable and Patient moving all extremities  Post vital signs: Reviewed and stable  Last Vitals:  Vitals Value Taken Time  BP    Temp    Pulse    Resp    SpO2      Last Pain:  Vitals:   07/26/20 1210  TempSrc:   PainSc: 0-No pain      Patients Stated Pain Goal: 5 (46/56/81 2751)  Complications: No complications documented.

## 2020-07-27 ENCOUNTER — Encounter (INDEPENDENT_AMBULATORY_CARE_PROVIDER_SITE_OTHER): Payer: Self-pay

## 2020-07-27 ENCOUNTER — Encounter (INDEPENDENT_AMBULATORY_CARE_PROVIDER_SITE_OTHER): Payer: Self-pay | Admitting: Gastroenterology

## 2020-07-27 LAB — SURGICAL PATHOLOGY

## 2020-07-31 ENCOUNTER — Ambulatory Visit (INDEPENDENT_AMBULATORY_CARE_PROVIDER_SITE_OTHER): Payer: BC Managed Care – PPO | Admitting: Gastroenterology

## 2020-07-31 ENCOUNTER — Encounter (HOSPITAL_COMMUNITY): Payer: Self-pay | Admitting: Gastroenterology

## 2020-08-03 ENCOUNTER — Other Ambulatory Visit: Payer: Self-pay

## 2020-08-03 ENCOUNTER — Ambulatory Visit (HOSPITAL_COMMUNITY)
Admission: RE | Admit: 2020-08-03 | Discharge: 2020-08-03 | Disposition: A | Discharge status: Home / Self Care | Payer: BC Managed Care – PPO | Source: Ambulatory Visit | Attending: Gastroenterology | Admitting: Gastroenterology

## 2020-08-03 ENCOUNTER — Other Ambulatory Visit (INDEPENDENT_AMBULATORY_CARE_PROVIDER_SITE_OTHER): Payer: Self-pay | Admitting: Gastroenterology

## 2020-08-03 DIAGNOSIS — G8929 Other chronic pain: Secondary | ICD-10-CM | POA: Diagnosis present

## 2020-08-03 DIAGNOSIS — R17 Unspecified jaundice: Secondary | ICD-10-CM | POA: Insufficient documentation

## 2020-08-03 DIAGNOSIS — R1011 Right upper quadrant pain: Secondary | ICD-10-CM | POA: Diagnosis not present

## 2020-08-03 MED ORDER — GADOBUTROL 1 MMOL/ML IV SOLN
7.0000 mL | Freq: Once | INTRAVENOUS | Status: AC | PRN
  Administered 2020-08-03: 7 mL via INTRAVENOUS

## 2020-08-17 ENCOUNTER — Other Ambulatory Visit (INDEPENDENT_AMBULATORY_CARE_PROVIDER_SITE_OTHER): Payer: Self-pay | Admitting: Gastroenterology

## 2020-08-17 ENCOUNTER — Telehealth (INDEPENDENT_AMBULATORY_CARE_PROVIDER_SITE_OTHER): Payer: Self-pay | Admitting: Gastroenterology

## 2020-08-17 DIAGNOSIS — R1012 Left upper quadrant pain: Secondary | ICD-10-CM

## 2020-08-17 MED ORDER — HYOSCYAMINE SULFATE ER 0.375 MG PO TB12
0.3750 mg | ORAL_TABLET | Freq: Two times a day (BID) | ORAL | 3 refills | Status: DC | PRN
Start: 2020-08-17 — End: 2020-10-17

## 2020-08-17 NOTE — Telephone Encounter (Signed)
Spoke to the patient today regarding the results of her MRCP which showed mild intrahepatic ductal dilation but no other acute alterations or obstruction.  It is very likely her symptoms are related to IBS.  I explained to her that I sent prescription for Levsin but her insurance did not approve this medication.  I will send Levbid  And assess her symptom improvement.  Hopefully it will approved by her insurance.

## 2020-08-17 NOTE — Telephone Encounter (Signed)
Patient left voice mail message stating she is still having some pain - states she is not on any medication-she would like to know what her plan of care will be - please advise - ph# [phone]

## 2020-10-15 ENCOUNTER — Other Ambulatory Visit (INDEPENDENT_AMBULATORY_CARE_PROVIDER_SITE_OTHER): Payer: Self-pay | Admitting: Gastroenterology

## 2020-10-15 DIAGNOSIS — R1012 Left upper quadrant pain: Secondary | ICD-10-CM

## 2020-10-17 ENCOUNTER — Other Ambulatory Visit (INDEPENDENT_AMBULATORY_CARE_PROVIDER_SITE_OTHER): Payer: Self-pay

## 2020-10-17 DIAGNOSIS — R1012 Left upper quadrant pain: Secondary | ICD-10-CM

## 2020-10-17 MED ORDER — HYOSCYAMINE SULFATE ER 0.375 MG PO TB12: ORAL_TABLET | ORAL | 3 refills | Status: DC

## 2020-10-26 ENCOUNTER — Encounter (INDEPENDENT_AMBULATORY_CARE_PROVIDER_SITE_OTHER): Payer: Self-pay | Admitting: Gastroenterology

## 2020-10-26 ENCOUNTER — Other Ambulatory Visit: Payer: Self-pay

## 2020-10-26 ENCOUNTER — Ambulatory Visit (INDEPENDENT_AMBULATORY_CARE_PROVIDER_SITE_OTHER): Payer: BC Managed Care – PPO | Admitting: Gastroenterology

## 2020-10-26 VITALS — BP 125/83 | HR 75 | Temp 97.6°F | Ht 61.0 in | Wt 141.9 lb

## 2020-10-26 DIAGNOSIS — R1010 Upper abdominal pain, unspecified: Secondary | ICD-10-CM | POA: Diagnosis not present

## 2020-10-26 DIAGNOSIS — R17 Unspecified jaundice: Secondary | ICD-10-CM

## 2020-10-26 DIAGNOSIS — R197 Diarrhea, unspecified: Secondary | ICD-10-CM

## 2020-10-26 DIAGNOSIS — R110 Nausea: Secondary | ICD-10-CM | POA: Diagnosis not present

## 2020-10-26 DIAGNOSIS — Z800 Family history of malignant neoplasm of digestive organs: Secondary | ICD-10-CM | POA: Diagnosis not present

## 2020-10-26 MED ORDER — COLESTIPOL HCL 1 G PO TABS: 2.0000 g | ORAL_TABLET | Freq: Every day | ORAL | 3 refills | Status: AC

## 2020-10-26 NOTE — Progress Notes (Signed)
Amanda Mullen, M.D. Gastroenterology & Hepatology [facility] [address] [place], [place] [address]  Primary Care Physician: Gaspar Skeeters, MD [address] [place] [address]  I will communicate my assessment and recommendations to the referring MD via EMR.  Problems: Left upper quadrant abdominal pain, possibly functional in nature Chronic right upper quadrant abdominal pain ?  Episodes of jaundice Family history of colon cancer, first-degree family member  History of Present Illness: Amanda Mullen is a 40 y.o. female  with past medical history migraines, who presents for follow up of left upper quadrant abdominal pain, chronic right upper quadrant abdominal pain and evaluation of episodes of jaundice.  The patient was last seen on 07/20/2020. At that time, the patient was scheduled for an EGD and a colonoscopy.  All procedures were performed on 07/26/2020, esophagogastroduodenospy showed a normal esophagus, stomach and duodenum.  Biopsies of stomach and duodenum were negative for H. pylori or celiac disease.  Colonoscopy showed presence of normal terminal ileum, colon was within normal limits.She was given a prescription for Levbid  to take as needed.  Given the concern for hepatobiliary etiology of her symptoms she was scheduled for an MRCP which showed Presence of a cholecystectomy status there was also presence of mild diffuse biliary ductal dilation with Maczis of her CBD of 11 mm but no presence of any obstruction or strictures.  The patient states that she has not felt any improvement since the last time she was seen in clinic.  She is states that her symptoms have been getting worse with time.  She has felt recurrent episodes of pain in her abdomen especially in her upper abdomen intermittently.  She states that she only took the Levbid  once in the past and it caused significant lightheadedness so she has tried only  taking it as needed when her pain is very severe and only takes half of the tablet.  Patient reports that on the weekend of July 4th she had sudden yellowish of her skin. Reports that at that time she had worsening of her abdominal pain as well as worsening of her constant nausea with vomiting episodes.  She states that the pain persisted but her jaundice went away on its own after a few days.  She did not seek any medical advice at that time. She also reports intermittent episodes of pale stools or presence of very bright stools.   Patient reports that she has presented persistent nausea for which she needed to take more Zofran than usual to improve the nausea.  Has tried to avoid caffeine, alcohol and fried foods as this type of food worsens her symptoms.  Not following any other specific type of diet.  She reports that when she has the episodes of severe abdominal pain she has presented episodes of diarrhea, which is new from her previous appointment. She is having watery diarrhea, between 4-5 Bms if its not a bad day but on a bad day it can happen with whenever she eats food. The diarrhea started since the 10/16/2020.  Denies any melena or hematochezia.  The patient denies having any fever, chills, hematemesis, jaundice, pruritus or weight loss.  Last EGD: As above Last Colonoscopy: As above  Past Medical History: Past Medical History:  Diagnosis Date   Migraine     Past Surgical History: Past Surgical History:  Procedure Laterality Date   BIOPSY  07/26/2020   Procedure: BIOPSY;  Surgeon: Dolores Frame, MD;  Location:  [facility];  Service: Gastroenterology;;   CERVICAL SPINE SURGERY     CESAREAN SECTION     CHOLECYSTECTOMY     COLONOSCOPY WITH PROPOFOL N/A 07/26/2020   Procedure: COLONOSCOPY WITH PROPOFOL;  Surgeon: Dolores Frame, MD;  Location: [facility];  Service: Gastroenterology;  Laterality: N/A;   ESOPHAGOGASTRODUODENOSCOPY (EGD) WITH PROPOFOL N/A  07/26/2020   Procedure: ESOPHAGOGASTRODUODENOSCOPY (EGD) WITH PROPOFOL;  Surgeon: Dolores Frame, MD;  Location: [facility];  Service: Gastroenterology;  Laterality: N/A;  PM   tubaligation     Tubes tied 2006    Family History: Family History  Problem Relation Age of Onset   Ovarian cancer Mother    Cervical cancer Mother    Kidney disease Father    Colon cancer Father    Diabetes Father    Healthy Brother    Healthy Brother     Social History: Social History   Tobacco Use  Smoking Status Never  Smokeless Tobacco Never   Social History   Substance and Sexual Activity  Alcohol Use Yes   Comment: occasionally   Social History   Substance and Sexual Activity  Drug Use Never    Allergies: Allergies  Allergen Reactions   Penicillins Other (See Comments)    Unknown reaction    Codeine     Caused pain in ribs.   Bactrim [Sulfamethoxazole-Trimethoprim] Rash   Ciprofloxacin Rash    Medications: Current Outpatient Medications  Medication Sig Dispense Refill   Acetaminophen (MIDOL PO) Take 2 tablets by mouth every 6 (six) hours as needed (headaches).     cetirizine (ZYRTEC) 10 MG tablet Take 10 mg by mouth daily as needed for allergies.     colestipol (COLESTID) 1 g tablet Take 2 tablets (2 g total) by mouth daily. 180 tablet 3   hyoscyamine (LEVBID) 0.375 MG 12 hr tablet TAKE 1 TABLET BY MOUTH EVERY 12 HOURS AS NEEDED (ABDOMINAL  PAIN) 30 tablet 3   ondansetron (ZOFRAN-ODT) 4 MG disintegrating tablet Take 4 mg by mouth every 8 (eight) hours as needed for nausea or vomiting.     Probiotic Product (PROBIOTIC PO) Take 1 capsule by mouth daily.     propranolol (INDERAL) 20 MG tablet Take 20 mg by mouth daily as needed (anxiety).     rizatriptan (MAXALT-MLT) 10 MG disintegrating tablet Take 10 mg by mouth as needed for migraine. May repeat in 2 hours if needed     No current facility-administered medications for this visit.    Review of Systems: GENERAL:  negative for malaise, night sweats HEENT: No changes in hearing or vision, no nose bleeds or other nasal problems. NECK: Negative for lumps, goiter, pain and significant neck swelling RESPIRATORY: Negative for cough, wheezing CARDIOVASCULAR: Negative for chest pain, leg swelling, palpitations, orthopnea GI: SEE HPI MUSCULOSKELETAL: Negative for joint pain or swelling, back pain, and muscle pain. SKIN: Negative for lesions, rash PSYCH: Negative for sleep disturbance, mood disorder and recent psychosocial stressors. HEMATOLOGY Negative for prolonged bleeding, bruising easily, and swollen nodes. ENDOCRINE: Negative for cold or heat intolerance, polyuria, polydipsia and goiter. NEURO: negative for tremor, gait imbalance, syncope and seizures. The remainder of the review of systems is noncontributory.   Physical Exam: BP 125/83 (BP Location: Right Arm, Patient Position: Sitting, Cuff Size: Small)   Pulse 75   Temp 97.6 F (36.4 C) (Oral)   Ht 5\' 1"  (1.549 m)   Wt 141 lb 14.4 oz (64.4 kg)   BMI 26.81 kg/m  GENERAL:  The patient is AO x3, in no acute distress. HEENT: Head is normocephalic and atraumatic. EOMI are intact. Mouth is well hydrated and without lesions. NECK: Supple. No masses LUNGS: Clear to auscultation. No presence of rhonchi/wheezing/rales. Adequate chest expansion HEART: RRR, normal s1 and s2. ABDOMEN: mildly tender upon palpation of her upper abdomen diffusely, no guarding, no peritoneal signs, and nondistended. BS +. No masses. EXTREMITIES: Without any cyanosis, clubbing, rash, lesions or edema. NEUROLOGIC: AOx3, no focal motor deficit. SKIN: no jaundice, no rashes  Imaging/Labs: as above  I personally reviewed and interpreted the available labs, imaging and endoscopic files.  Impression and Plan: Burnis Kaser is a 40 y.o. female  with past medical history migraines, who presents for follow up of left upper quadrant abdominal pain, chronic right upper quadrant  abdominal pain and evaluation of episodes of jaundice.  The patient has presented persistent episodes of abdominal pain of unclear etiology. As part of the evaluation of the symptoms she has undergone recent endoscopy and cross-sectional abdominal imaging with an MRCP and CT of the abdomen and pelvis with IV contrast that did not show any potential explanation of the left upper quadrant abdominal pain.  I explained to the patient that this is likely functional in nature.  She had some improvement with the use of Levbid, which she can continue taking.  I agree that if she is having some side effects with 1 tablet she can have half tablet as needed.  Nevertheless, the patient presented with possible episode of jaundice in the past.  Unfortunately she did not seek any medical help at that time and labs/imaging could not be performed at that moment, which will be the best timing to establish if there was any acute hepatobiliary alteration justifying an endoscopic retrograde cholangiopancreatography.  Given the concern for SOD, I discussed the case with Dr. Karilyn Cota and we agree that we may need to proceed with this type of procedure if her LFTs increase.  I will check a CMP today.  If the CMP shows any elevation (previously it was showing mild elevation of the ALT), we we will consider proceed with an endoscopic retrograde cholangiopancreatography.  In terms of her new onset diarrhea, this could also be related to IBS-D symptoms.  However, as she has a postcholecystectomy status, this can also be associated to bile salt induced diarrhea.  I will start her on colestipol daily.  She can continue taking Zofran as needed for nausea.  -Continue taking half tablet of Levbid as needed for abdominal pain -Continue Zofran as needed -Start Colestipol 2 g daily -Check CBC, CMP and direct bilirubin  All questions were answered.      Dolores Frame, MD Gastroenterology and Hepatology [facility]

## 2020-10-26 NOTE — Patient Instructions (Addendum)
Continue taking half tablet of Levbid as needed for abdominal pain Continue Zofran as needed Start Colestipol daily Will discuss case with Dr. Karilyn Cota for possibility of ERCP Perform blood workup

## 2020-10-27 LAB — CBC WITH DIFFERENTIAL/PLATELET
Absolute Monocytes: 426 cells/uL (ref 200–950)
Basophils Absolute: 72 cells/uL (ref 0–200)
Basophils Relative: 1.2 %
Eosinophils Absolute: 180 cells/uL (ref 15–500)
Eosinophils Relative: 3 %
HCT: 40.7 % (ref 35.0–45.0)
Hemoglobin: 13.4 g/dL (ref 11.7–15.5)
Lymphs Abs: 1800 cells/uL (ref 850–3900)
MCH: 30.1 pg (ref 27.0–33.0)
MCHC: 32.9 g/dL (ref 32.0–36.0)
MCV: 91.5 fL (ref 80.0–100.0)
MPV: 10.1 fL (ref 7.5–12.5)
Monocytes Relative: 7.1 %
Neutro Abs: 3522 cells/uL (ref 1500–7800)
Neutrophils Relative %: 58.7 %
Platelets: 274 10*3/uL (ref 140–400)
RBC: 4.45 10*6/uL (ref 3.80–5.10)
RDW: 12.3 % (ref 11.0–15.0)
Total Lymphocyte: 30 %
WBC: 6 10*3/uL (ref 3.8–10.8)

## 2020-10-27 LAB — COMPREHENSIVE METABOLIC PANEL
AG Ratio: 1.6 (calc) (ref 1.0–2.5)
ALT: 39 U/L — ABNORMAL HIGH (ref 6–29)
AST: 26 U/L (ref 10–30)
Albumin: 4.6 g/dL (ref 3.6–5.1)
Alkaline phosphatase (APISO): 69 U/L (ref 31–125)
BUN: 9 mg/dL (ref 7–25)
CO2: 25 mmol/L (ref 20–32)
Calcium: 9.6 mg/dL (ref 8.6–10.2)
Chloride: 103 mmol/L (ref 98–110)
Creat: 0.69 mg/dL (ref 0.50–0.97)
Globulin: 2.9 g/dL (calc) (ref 1.9–3.7)
Glucose, Bld: 79 mg/dL (ref 65–139)
Potassium: 4.2 mmol/L (ref 3.5–5.3)
Sodium: 137 mmol/L (ref 135–146)
Total Bilirubin: 1.2 mg/dL (ref 0.2–1.2)
Total Protein: 7.5 g/dL (ref 6.1–8.1)

## 2020-10-27 LAB — BILIRUBIN, DIRECT: Bilirubin, Direct: 0.2 mg/dL (ref 0.0–0.2)

## 2020-11-02 ENCOUNTER — Encounter (INDEPENDENT_AMBULATORY_CARE_PROVIDER_SITE_OTHER): Payer: Self-pay

## 2020-11-03 NOTE — Progress Notes (Signed)
She did by MyChart message but I told her to call back once she could talk about the procedure

## 2020-11-08 ENCOUNTER — Telehealth (INDEPENDENT_AMBULATORY_CARE_PROVIDER_SITE_OTHER): Payer: Self-pay | Admitting: Gastroenterology

## 2020-11-08 NOTE — Telephone Encounter (Signed)
Spoke to patient on the phone.  We discussed in detail the fact that she had mild elevation of her aminotransferases, specifically only one of the aminotransferases.  Other liver enzymes were within normal limits.  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  She understood this and would like to continue taking the Levsin as needed but she may consider proceeding with a endoscopic retrograde cholangiopancreatography later during this year if her symptoms persist.  I also discussed the possibility of using TCAs low-dose and she will think about it.

## 2020-11-14 ENCOUNTER — Encounter (INDEPENDENT_AMBULATORY_CARE_PROVIDER_SITE_OTHER): Payer: Self-pay

## 2020-11-14 ENCOUNTER — Other Ambulatory Visit (INDEPENDENT_AMBULATORY_CARE_PROVIDER_SITE_OTHER): Payer: Self-pay | Admitting: Gastroenterology

## 2020-11-14 MED ORDER — ONDANSETRON 4 MG PO TBDP: 4.0000 mg | ORAL_TABLET | Freq: Three times a day (TID) | ORAL | 3 refills | Status: DC | PRN

## 2021-02-12 ENCOUNTER — Ambulatory Visit (INDEPENDENT_AMBULATORY_CARE_PROVIDER_SITE_OTHER): Payer: BC Managed Care – PPO | Admitting: Gastroenterology

## 2021-02-21 ENCOUNTER — Ambulatory Visit (INDEPENDENT_AMBULATORY_CARE_PROVIDER_SITE_OTHER): Payer: BC Managed Care – PPO | Admitting: Gastroenterology

## 2021-07-10 ENCOUNTER — Encounter (INDEPENDENT_AMBULATORY_CARE_PROVIDER_SITE_OTHER): Payer: Self-pay | Admitting: Internal Medicine

## 2021-07-10 ENCOUNTER — Ambulatory Visit (INDEPENDENT_AMBULATORY_CARE_PROVIDER_SITE_OTHER): Payer: BC Managed Care – PPO | Admitting: Internal Medicine

## 2021-07-10 ENCOUNTER — Other Ambulatory Visit: Payer: Self-pay

## 2021-07-10 VITALS — BP 109/75 | HR 109 | Temp 98.0°F | Ht 61.0 in | Wt 124.7 lb

## 2021-07-10 DIAGNOSIS — R7989 Other specified abnormal findings of blood chemistry: Secondary | ICD-10-CM | POA: Diagnosis not present

## 2021-07-10 DIAGNOSIS — R1010 Upper abdominal pain, unspecified: Secondary | ICD-10-CM

## 2021-07-10 NOTE — Patient Instructions (Signed)
Physician will call with results of blood test and further recommendations. 

## 2021-07-10 NOTE — Progress Notes (Signed)
Presenting complaint; ? ?Nausea and vomiting. ?Abdominal pain. ? ?Database and subjective: ? ?Patient is 41 year old Caucasian female who is here for scheduled visit.  She was last seen by Dr. Levon Hedger on 10/26/2020. ?Patient has history of right upper quadrant abdominal pain dating back to about 8 years ago.  Testing was negative for cholelithiasis.  HIDA scan revealed EF of less than 10%.  She had cholecystectomy and remained symptom-free for about 4 years. ?She began to have left upper quadrant pain in November 2021.  She did not respond to symptomatic therapy.  [facility] and noted to have bilirubin of 1.8 and transaminases were normal.  Bilirubin was not fractionated.  CT abdomen and pelvis was unremarkable and serum lipase was 13.  She was felt to have peptic ulcer disease and treated with omeprazole.  She had another episode of severe pain in April 2022 and once again seen in emergency room at [facility].  CT abdomen and pelvis was unremarkable.  Lab studies revealed ALT of 80 and AST of 30.  Bilirubin was 1.1 AP 90.  Serum calcium was 8.8. ?She was therefore referred to our office and seen by Dr. Levon Hedger on 07/20/2020.  She was advised to use Levsin sublingual for pain and ondansetron for nausea and vomiting.  Sucralfate and omeprazole were discontinued as she was not getting relief. ?She had EGD and colonoscopy on 07/26/2020. ?Both of these exams were normal.  Gastric biopsy was negative for H. pylori or eosinophilic gastritis and duodenal biopsy did not reveal any changes of celiac disease. ?Patient had MRCP on 08/03/2020 revealing dilated bile duct measuring 11 mm.  No evidence of choledocholithiasis or pancreatic abnormality. ?She followed up with Dr. Levon Hedger on 10/26/2020.  He felt she could also have IBS/diarrhea.  She was prescribed colestipol and and begun on Levophed and advised to continue ondansetron on as-needed basis.  Lab studies from this visit revealed AST of 26 and  ALT of 39 with AP of 190 total bilirubin of 1.2 and direct 0.2.  CBC was normal.  Eosinophils 1.2%. ? ?Patient she took colestipol for 3 months and did not see any difference.  Therefore she stopped this medication.  She has been using Levbid half a tablet at a time on as-needed basis as higher dose makes her drowsy. ?Her symptoms have gotten worse over the last 2 to 3 months.  She is having nausea virtually every day.  If she does not take ondansetron daily she starts having vomiting.  Last episode was about 8 days ago when she vomited for 48 hours.  Vomitus consisted of food and then bile.  On some days she is taking antiemetic twice.  She continues to complain of pain in right upper quadrant which is at times stabbing and sharp pain and on other occasions is burning.  Pain is fairly localized.  She is not having any pain on the left side.  On most days she has 2 stools.  Every week or 2 she gets postprandial bowel movement and the symptoms seem to be helped with Levbid.  She denies melena or rectal bleeding. ?She has noted worsening nausea and pain with wine which she does not drink anymore.  Caffeinated drinks also worsen her symptoms.  She does not take OTC NSAIDs. ?She has very poor appetite.  She is forcing herself to eat.  She has lost 17 pounds since her last visit of July 2022 when she weighed 141 pounds.  She states  her baseline weight has been 144 pounds. ? ?Patient states when she had colonoscopy she told Dr. Levon Hedger that her father had colon cancer which she states her father was treated for gastric cancer and not colon cancer.  Family history is positive for colon cancer in maternal as well as paternal aunt. ?She has 2 younger brothers ages 85 and 8 who are in good health. ?Mother was treated for ovarian cancer when she was in her 30s.  She is doing fine in her late 8s. ?Family history is negative for pancreatic disorders. ? ?Current Medications: ?Outpatient Encounter Medications as of 07/10/2021   ?Medication Sig  ? Acetaminophen (MIDOL PO) Take 2 tablets by mouth every 6 (six) hours as needed (headaches).  ? hyoscyamine (LEVBID) 0.375 MG 12 hr tablet TAKE 1 TABLET BY MOUTH EVERY 12 HOURS AS NEEDED (ABDOMINAL  PAIN)  ? ondansetron (ZOFRAN-ODT) 4 MG disintegrating tablet Take 1 tablet (4 mg total) by mouth every 8 (eight) hours as needed for nausea or vomiting.  ? Probiotic Product (PROBIOTIC PO) Take 1 capsule by mouth daily.  ? propranolol (INDERAL) 20 MG tablet Take 20 mg by mouth daily as needed (anxiety).  ? rizatriptan (MAXALT-MLT) 10 MG disintegrating tablet Take 10 mg by mouth as needed for migraine. May repeat in 2 hours if needed  ? colestipol (COLESTID) 1 g tablet Take 2 tablets (2 g total) by mouth daily. (Patient not taking: Reported on 07/10/2021)  ? ?No facility-administered encounter medications on file as of 07/10/2021.  ? ? ? ?Objective: ?Blood pressure 109/75, pulse (!) 109, temperature 98 ?F (36.7 ?C), temperature source Oral, height 5\' 1"  (1.549 m), weight 124 lb 11.2 oz (56.6 kg). ?Patient is alert and in no acute distress. ?Conjunctiva is pink. Sclera is nonicteric ?Oropharyngeal mucosa is normal. ?No neck masses or thyromegaly noted. ?Cardiac exam with regular rhythm normal S1 and S2. No murmur or gallop noted. ?Lungs are clear to auscultation. ?Abdomen is symmetrical.  Bowel sounds are normal.  On palpation abdomen is soft.  Mild tenderness noted below the right costal margin.  No organomegaly or masses. ?No LE edema or clubbing noted. ? ?Labs/studies Results: ? ? ? ?  Latest Ref Rng & Units 10/26/2020  ? 10:07 AM  ?CBC  ?WBC 3.8 - 10.8 Thousand/uL 6.0    ?Hemoglobin 11.7 - 15.5 g/dL 10/28/2020    ?Hematocrit 35.0 - 45.0 % 40.7    ?Platelets 140 - 400 Thousand/uL 274    ?  ? ?  Latest Ref Rng & Units 10/26/2020  ? 10:07 AM  ?CMP  ?Glucose 65 - 139 mg/dL 79    ?BUN 7 - 25 mg/dL 9    ?Creatinine 0.50 - 0.97 mg/dL 10/28/2020    ?Sodium 135 - 146 mmol/L 137    ?Potassium 3.5 - 5.3 mmol/L 4.2    ?Chloride  98 - 110 mmol/L 103    ?CO2 20 - 32 mmol/L 25    ?Calcium 8.6 - 10.2 mg/dL 9.6    ?Total Protein 6.1 - 8.1 g/dL 7.5    ?Total Bilirubin 0.2 - 1.2 mg/dL 1.2    ?AST 10 - 30 U/L 26    ?ALT 6 - 29 U/L 39    ?  ? ?  Latest Ref Rng & Units 10/26/2020  ? 10:07 AM  ?Hepatic Function  ?Total Protein 6.1 - 8.1 g/dL 7.5    ?AST 10 - 30 U/L 26    ?ALT 6 - 29 U/L 39    ?Total  Bilirubin 0.2 - 1.2 mg/dL 1.2    ?Bilirubin, Direct 0.0 - 0.2 mg/dL 0.2    ?  ?Serum lipase 71 on 04/19/2020 ?Serum lipase 140 on 07/17/2020 ? ? ? ? ?Assessment: ? ?#1.  Nausea, vomiting and upper abdominal pain which is now predominating right upper quadrant associated with anorexia and weight loss.  She has mild elevation of lipase and mildly elevated ALT but no evidence of pancreatitis on 2 CTs and MRCP last year. ?MRCP last year revealed bile duct measuring 11 mm which is more than we typically see postcholecystectomy.  Biliary dilation occurred since CT of April 2022.  This finding combined with mildly elevated ALT raises the possibility of sphincter of Oddi dysfunction.  She could also have microlithiasis.  Pancreas divisum also remains in differential diagnosis although she has not had equivocal diagnosis of pancreatitis. ?No definite indication for ERCP at this time. ?Given worsening symptoms she would benefit from reevaluation with lab studies and MRCP. ? ?#2.  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 ? ?#3.  Intermittent postprandial diarrhea with urgency secondary to IBS. ? ? ? ?Plan: ? ?Medication list updated ?Patient will go to the lab for serum amylase, lipase and LFTs. ?I will review imaging studies with Dr. Tyron RussellBoles and determine if she should have repeat MRCP or secretin stimulated MRCP. ?Follow-up visit date to be determined. ? ? ? ? ?

## 2021-07-11 ENCOUNTER — Other Ambulatory Visit (INDEPENDENT_AMBULATORY_CARE_PROVIDER_SITE_OTHER): Payer: Self-pay

## 2021-07-11 DIAGNOSIS — R7989 Other specified abnormal findings of blood chemistry: Secondary | ICD-10-CM

## 2021-07-11 DIAGNOSIS — R112 Nausea with vomiting, unspecified: Secondary | ICD-10-CM

## 2021-07-11 DIAGNOSIS — R1010 Upper abdominal pain, unspecified: Secondary | ICD-10-CM

## 2021-07-11 LAB — HEPATIC FUNCTION PANEL
AG Ratio: 1.7 (calc) (ref 1.0–2.5)
ALT: 25 U/L (ref 6–29)
AST: 20 U/L (ref 10–30)
Albumin: 4.7 g/dL (ref 3.6–5.1)
Alkaline phosphatase (APISO): 57 U/L (ref 31–125)
Bilirubin, Direct: 0.3 mg/dL — ABNORMAL HIGH (ref 0.0–0.2)
Globulin: 2.7 g/dL (calc) (ref 1.9–3.7)
Indirect Bilirubin: 1 mg/dL (calc) (ref 0.2–1.2)
Total Bilirubin: 1.3 mg/dL — ABNORMAL HIGH (ref 0.2–1.2)
Total Protein: 7.4 g/dL (ref 6.1–8.1)

## 2021-07-11 LAB — AMYLASE: Amylase: 58 U/L (ref 21–101)

## 2021-07-11 LAB — LIPASE: Lipase: 32 U/L (ref 7–60)

## 2021-08-01 ENCOUNTER — Ambulatory Visit (HOSPITAL_COMMUNITY): Payer: BC Managed Care – PPO

## 2021-08-14 ENCOUNTER — Other Ambulatory Visit (INDEPENDENT_AMBULATORY_CARE_PROVIDER_SITE_OTHER): Payer: Self-pay | Admitting: Internal Medicine

## 2021-08-14 ENCOUNTER — Ambulatory Visit (HOSPITAL_COMMUNITY)
Admission: RE | Admit: 2021-08-14 | Discharge: 2021-08-14 | Disposition: A | Discharge status: Home / Self Care | Payer: BC Managed Care – PPO | Source: Ambulatory Visit | Attending: Internal Medicine | Admitting: Internal Medicine

## 2021-08-14 DIAGNOSIS — R112 Nausea with vomiting, unspecified: Secondary | ICD-10-CM | POA: Diagnosis not present

## 2021-08-14 DIAGNOSIS — R7989 Other specified abnormal findings of blood chemistry: Secondary | ICD-10-CM

## 2021-08-14 DIAGNOSIS — R1010 Upper abdominal pain, unspecified: Secondary | ICD-10-CM

## 2021-08-14 MED ORDER — GADOBUTROL 1 MMOL/ML IV SOLN
7.0000 mL | Freq: Once | INTRAVENOUS | Status: AC | PRN
  Administered 2021-08-14: 7 mL via INTRAVENOUS

## 2021-08-22 ENCOUNTER — Other Ambulatory Visit (INDEPENDENT_AMBULATORY_CARE_PROVIDER_SITE_OTHER): Payer: Self-pay | Admitting: *Deleted

## 2021-08-22 DIAGNOSIS — R7989 Other specified abnormal findings of blood chemistry: Secondary | ICD-10-CM

## 2021-10-04 ENCOUNTER — Encounter (INDEPENDENT_AMBULATORY_CARE_PROVIDER_SITE_OTHER): Payer: Self-pay | Admitting: Internal Medicine

## 2021-10-16 LAB — HEPATIC FUNCTION PANEL
AG Ratio: 1.8 (calc) (ref 1.0–2.5)
ALT: 21 U/L (ref 6–29)
AST: 17 U/L (ref 10–30)
Albumin: 4.4 g/dL (ref 3.6–5.1)
Alkaline phosphatase (APISO): 49 U/L (ref 31–125)
Bilirubin, Direct: 0.2 mg/dL (ref 0.0–0.2)
Globulin: 2.5 g/dL (calc) (ref 1.9–3.7)
Indirect Bilirubin: 1 mg/dL (calc) (ref 0.2–1.2)
Total Bilirubin: 1.2 mg/dL (ref 0.2–1.2)
Total Protein: 6.9 g/dL (ref 6.1–8.1)

## 2021-11-23 ENCOUNTER — Other Ambulatory Visit (INDEPENDENT_AMBULATORY_CARE_PROVIDER_SITE_OTHER): Payer: Self-pay | Admitting: Gastroenterology

## 2022-01-07 ENCOUNTER — Other Ambulatory Visit (INDEPENDENT_AMBULATORY_CARE_PROVIDER_SITE_OTHER): Payer: Self-pay | Admitting: Gastroenterology

## 2022-02-10 ENCOUNTER — Encounter (INDEPENDENT_AMBULATORY_CARE_PROVIDER_SITE_OTHER): Payer: Self-pay | Admitting: Internal Medicine

## 2022-02-11 ENCOUNTER — Other Ambulatory Visit (INDEPENDENT_AMBULATORY_CARE_PROVIDER_SITE_OTHER): Payer: Self-pay | Admitting: Gastroenterology

## 2022-02-11 DIAGNOSIS — R1012 Left upper quadrant pain: Secondary | ICD-10-CM

## 2022-02-11 MED ORDER — HYOSCYAMINE SULFATE ER 0.375 MG PO TB12: ORAL_TABLET | ORAL | 1 refills | Status: AC

## 2022-02-11 NOTE — Telephone Encounter (Signed)
Requesting refill of hysoscamine to [facility] [place]. Last seen by dr Laural Golden 07/10/21. No upcoming appt scheduled.

## 2022-03-14 ENCOUNTER — Other Ambulatory Visit (INDEPENDENT_AMBULATORY_CARE_PROVIDER_SITE_OTHER): Payer: Self-pay | Admitting: Gastroenterology

## 2022-03-14 NOTE — Telephone Encounter (Signed)
Last seen 07/10/21 by Dr. Karilyn Cota

## 2022-04-23 ENCOUNTER — Encounter (INDEPENDENT_AMBULATORY_CARE_PROVIDER_SITE_OTHER): Payer: Self-pay | Admitting: Internal Medicine

## 2022-04-24 NOTE — Telephone Encounter (Signed)
I have called and left a detailed message that she needed to proceed to the Ed due to her Symptoms.

## 2022-04-25 NOTE — Telephone Encounter (Signed)
I seen that the patient had read the note from [facility] on 04/24/2022 in regards to being evaluated at local ED.

## 2022-04-25 NOTE — Telephone Encounter (Signed)
I tried calling the patient, she is not answering the phone.

## 2022-04-29 ENCOUNTER — Ambulatory Visit (INDEPENDENT_AMBULATORY_CARE_PROVIDER_SITE_OTHER): Payer: BC Managed Care – PPO | Admitting: Gastroenterology

## 2022-05-30 ENCOUNTER — Emergency Department (HOSPITAL_COMMUNITY)
Admission: EM | Admit: 2022-05-30 | Discharge: 2022-05-30 | Disposition: A | Discharge status: Home / Self Care | Payer: BC Managed Care – PPO | Attending: Emergency Medicine | Admitting: Emergency Medicine

## 2022-05-30 ENCOUNTER — Other Ambulatory Visit: Payer: Self-pay

## 2022-05-30 ENCOUNTER — Encounter (HOSPITAL_COMMUNITY): Payer: Self-pay

## 2022-05-30 DIAGNOSIS — Z5321 Procedure and treatment not carried out due to patient leaving prior to being seen by health care provider: Secondary | ICD-10-CM | POA: Insufficient documentation

## 2022-05-30 DIAGNOSIS — R1031 Right lower quadrant pain: Secondary | ICD-10-CM | POA: Diagnosis present

## 2022-05-30 DIAGNOSIS — R110 Nausea: Secondary | ICD-10-CM | POA: Diagnosis not present

## 2022-05-30 LAB — CBC
HCT: 46.3 % — ABNORMAL HIGH (ref 36.0–46.0)
Hemoglobin: 15.1 g/dL — ABNORMAL HIGH (ref 12.0–15.0)
MCH: 30.9 pg (ref 26.0–34.0)
MCHC: 32.6 g/dL (ref 30.0–36.0)
MCV: 94.9 fL (ref 80.0–100.0)
Platelets: 326 10*3/uL (ref 150–400)
RBC: 4.88 MIL/uL (ref 3.87–5.11)
RDW: 12.1 % (ref 11.5–15.5)
WBC: 5.3 10*3/uL (ref 4.0–10.5)
nRBC: 0 % (ref 0.0–0.2)

## 2022-05-30 LAB — COMPREHENSIVE METABOLIC PANEL
ALT: 34 U/L (ref 0–44)
AST: 27 U/L (ref 15–41)
Albumin: 4.7 g/dL (ref 3.5–5.0)
Alkaline Phosphatase: 57 U/L (ref 38–126)
Anion gap: 9 (ref 5–15)
BUN: 10 mg/dL (ref 6–20)
CO2: 27 mmol/L (ref 22–32)
Calcium: 9.4 mg/dL (ref 8.9–10.3)
Chloride: 100 mmol/L (ref 98–111)
Creatinine, Ser: 0.6 mg/dL (ref 0.44–1.00)
GFR, Estimated: >60 mL/min (ref >60)
Glucose, Bld: 85 mg/dL (ref 70–99)
Potassium: 3.7 mmol/L (ref 3.5–5.1)
Sodium: 136 mmol/L (ref 135–145)
Total Bilirubin: 2.3 mg/dL — ABNORMAL HIGH (ref 0.3–1.2)
Total Protein: 8.1 g/dL (ref 6.5–8.1)

## 2022-05-30 LAB — LIPASE, BLOOD: Lipase: 37 U/L (ref 11–51)

## 2022-05-30 NOTE — ED Provider Triage Note (Signed)
Emergency Medicine Provider Triage Evaluation Note  Pessie Pouliot , a 42 y.o. female  was evaluated in triage.  Pt complains of abdominal pain.  Started 3 weeks ago.  In the right lower quadrant.  Has gotten progressively worse.  Feels like pins and is nonradiating.  Endorses nausea but no vomiting diarrhea.  Denies urinary changes. denies abnormal vaginal discharge or bleeding.  Review of Systems  Positive: See above  Negative: See above  Physical Exam  BP (!) 122/96 (BP Location: Right Arm)   Pulse 68   Temp 98.2 F (36.8 C) (Oral)   Resp 14   Ht 5' (1.524 m)   Wt 52.2 kg   LMP 05/20/2022   SpO2 100%   BMI 22.46 kg/m  Gen:   Awake, no distress   Resp:  Normal effort  MSK:   Moves extremities without difficulty Other:  Rlq tender  Medical Decision Making  Medically screening exam initiated at 1:40 PM.  Appropriate orders placed.  Rayhanna Lolley was informed that the remainder of the evaluation will be completed by another provider, this initial triage assessment does not replace that evaluation, and the importance of remaining in the ED until their evaluation is complete.  Work up started   Harriet Pho, [place] 05/30/22 1341

## 2022-05-30 NOTE — ED Triage Notes (Signed)
Patient states intermittent stabbing pain in the RLQ for the past 3 weeks. Patient states waking up this morning nauseous and pain increased this morning. Patient states today pain is intermittent but more painful then usual.

## 2022-05-31 ENCOUNTER — Encounter (INDEPENDENT_AMBULATORY_CARE_PROVIDER_SITE_OTHER): Payer: Self-pay | Admitting: Internal Medicine

## 2023-05-08 ENCOUNTER — Other Ambulatory Visit (INDEPENDENT_AMBULATORY_CARE_PROVIDER_SITE_OTHER): Payer: Self-pay | Admitting: Gastroenterology

## 2023-06-21 IMAGING — MR MR ABDOMEN WO/W CM MRCP
19 of 22 series · 43 of 48 positions shown · IV contrast (Contrast agent)
Comparison: MRI abdomen 08/03/2020

CLINICAL DATA: Right upper quadrant pain

EXAM:
MRI ABDOMEN WITHOUT AND WITH CONTRAST (INCLUDING MRCP)
TECHNIQUE: Multiplanar multisequence MR imaging of the abdomen was performed
both before and after the administration of intravenous contrast.
Heavily T2-weighted images of the biliary and pancreatic ducts were
obtained, and three-dimensional MRCP images were rendered by post
processing.
CONTRAST:  7mL GADAVIST GADOBUTROL 1 MMOL/ML IV SOLN

[Series 3: ax haste · axial · 6.0mm · 1.19mm/px · 1 of 30 slices shown]
[im 1/30]
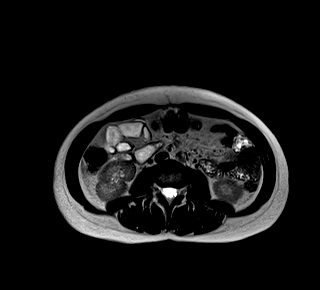

[Series 4: cor haste · coronal · 6.0mm · 1.25mm/px · 1 of 26 slices shown]
[im 1/26]
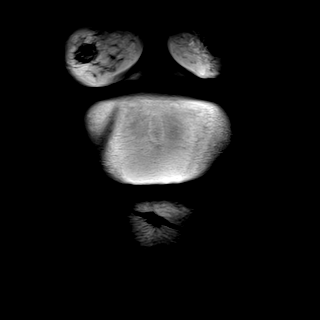

[Series 5: T2 fat-sat · axial · 6.0mm · 1.19mm/px · 1 of 30 slices shown]
[im 1/30]
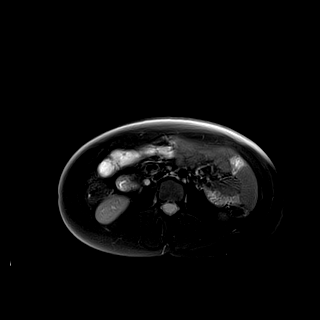

[Series 9: DWI · axial · 6.0mm · 1.42mm/px · 1 of 30 slices shown (1 of 4)]
[im 1/30]
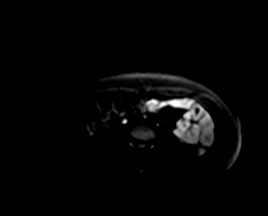

[Series 9: DWI · axial · 6.0mm · 1.42mm/px · 1 of 30 slices shown (2 of 4)]
[im 1/30]
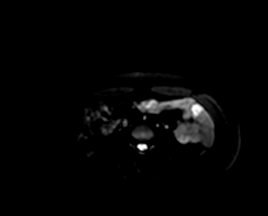

[Series 9: DWI · axial · 6.0mm · 1.42mm/px · 1 of 30 slices shown (3 of 4)]
[im 1/30]
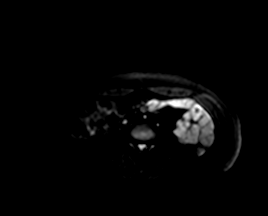

[Series 10: DWI · axial · 6.0mm · 1.42mm/px · 1 of 30 slices shown (4 of 4)]
[im 1/30]
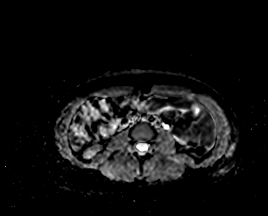

[Series 11: ax in and · axial · 3.0mm · 1.19mm/px · z∈[-138,+75]mm · 3 of 72 slices shown (1 of 2)]
[im 1/72]
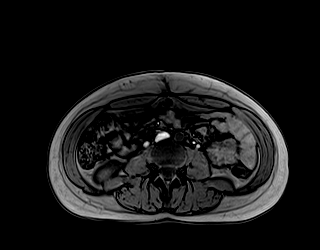
[im 36/72]
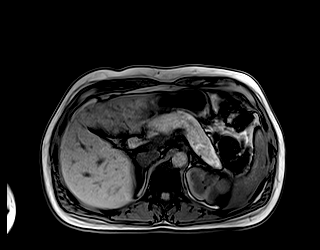
[im 72/72]
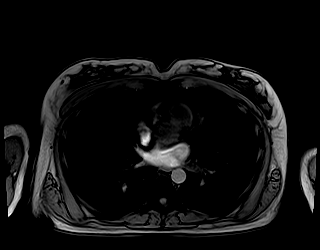

[Series 12: ax in and · axial · 3.0mm · 1.19mm/px · z∈[-138,+75]mm · 3 of 72 slices shown (2 of 2)]
[im 1/72]
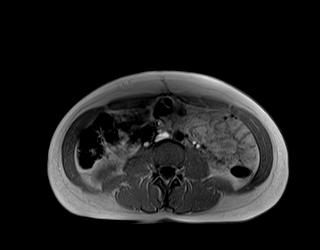
[im 36/72]
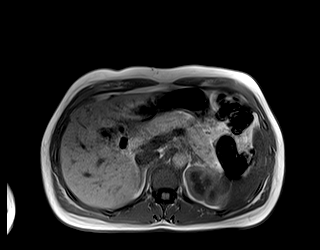
[im 72/72]
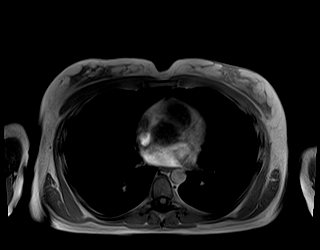

[Series 13: T1 dynamic · axial · non-contrast · 3.0mm · 1.19mm/px · z∈[-138,+75]mm · 3 of 72 slices shown (1 of 4)]
[im 1/72]
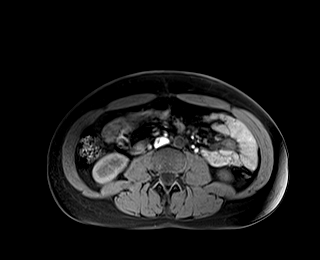
[im 36/72]
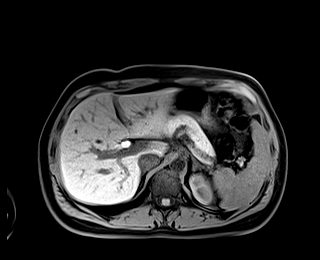
[im 72/72]
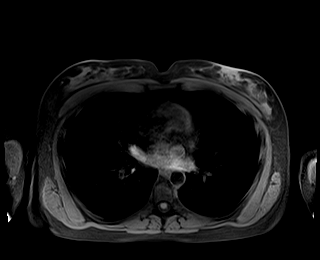

[Series 15: T1 dynamic post-contrast · axial · 3.0mm · 1.19mm/px · z∈[-138,+75]mm · 3 of 72 slices shown (1 of 6)]
[im 1/72]
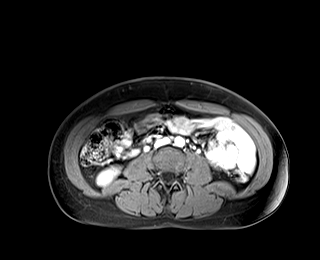
[im 36/72]
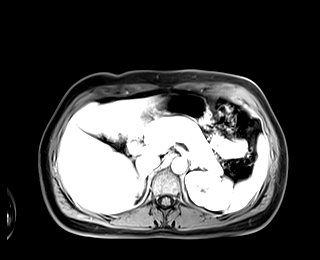
[im 72/72]
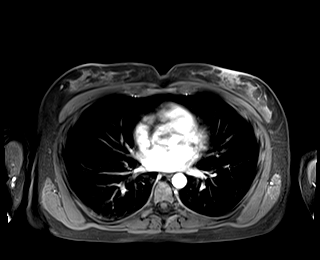

[Series 16: T1 dynamic · axial · 3.0mm · 1.19mm/px · z∈[-138,+75]mm · 3 of 72 slices shown (2 of 4)]
[im 1/72]
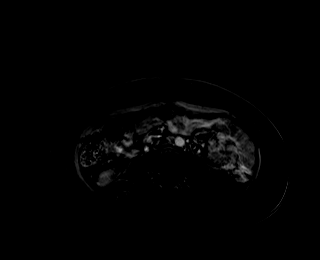
[im 36/72]
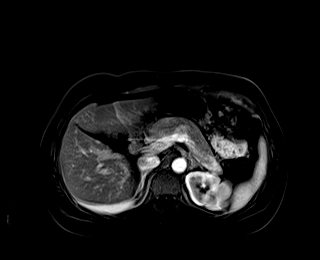
[im 72/72]
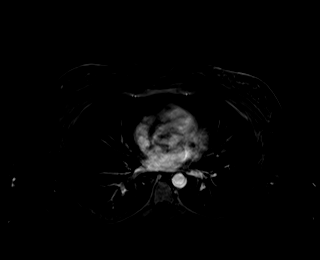

[Series 17: T1 dynamic post-contrast · axial · 3.0mm · 1.19mm/px · z∈[-138,+75]mm · 3 of 72 slices shown (2 of 6)]
[im 1/72]
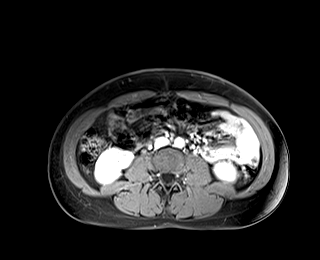
[im 36/72]
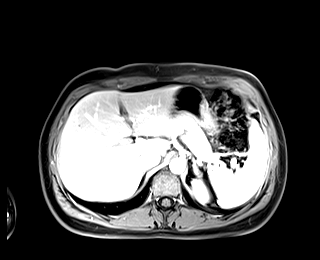
[im 72/72]
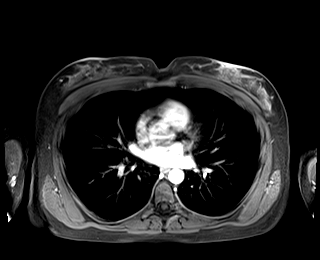

[Series 18: T1 dynamic · axial · 3.0mm · 1.19mm/px · z∈[-138,+75]mm · 3 of 72 slices shown (3 of 4)]
[im 1/72]
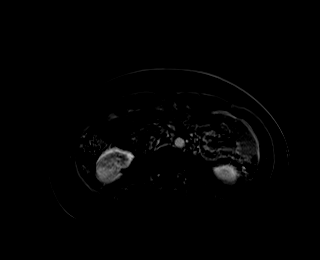
[im 36/72]
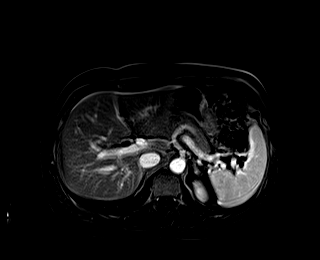
[im 72/72]
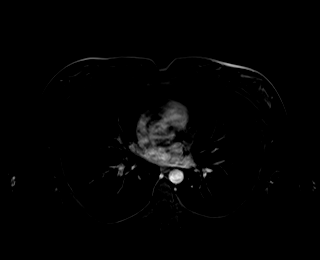

[Series 19: T1 dynamic post-contrast · axial · 3.0mm · 1.19mm/px · z∈[-138,+75]mm · 3 of 72 slices shown (3 of 6)]
[im 1/72]
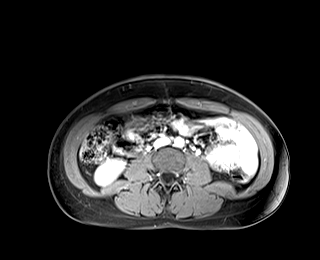
[im 36/72]
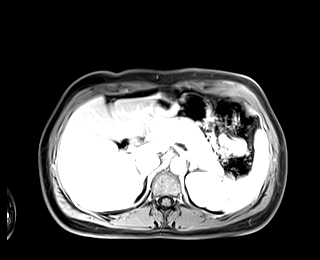
[im 72/72]
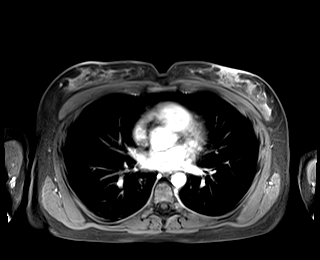

[Series 20: T1 dynamic · axial · 3.0mm · 1.19mm/px · z∈[-138,+75]mm · 3 of 72 slices shown (4 of 4)]
[im 1/72]
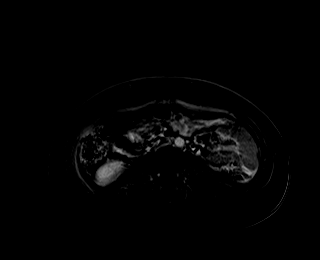
[im 36/72]
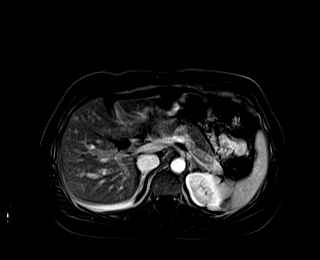
[im 72/72]
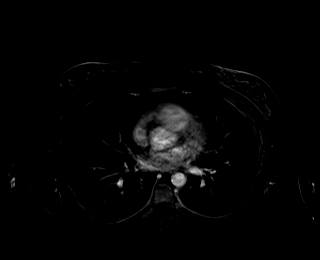

[Series 21: T1 dynamic post-contrast · coronal · 3.0mm · 1.31mm/px · 3 of 72 slices shown (4 of 6)]
[im 1/72]
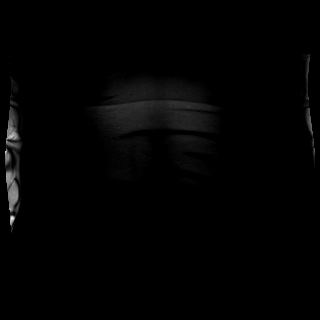
[im 36/72]
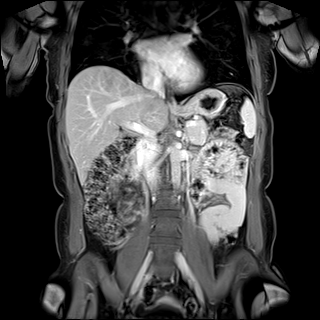
[im 72/72]
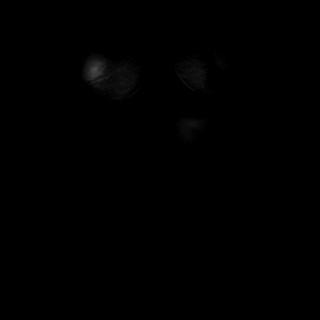

[Series 22: T1 dynamic post-contrast · axial · 3.0mm · 1.19mm/px · z∈[-138,+75]mm · 3 of 72 slices shown (5 of 6)]
[im 1/72]
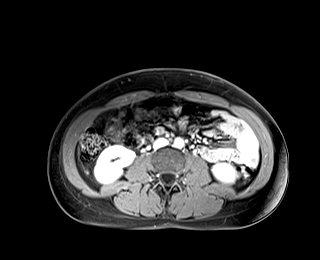
[im 36/72]
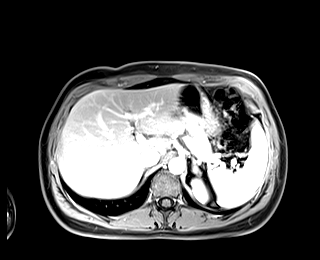
[im 72/72]
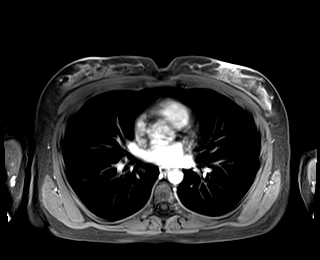

[Series 23: T1 dynamic post-contrast · axial · 3.0mm · 1.19mm/px · z∈[-138,+75]mm · 3 of 72 slices shown (6 of 6)]
[im 1/72]
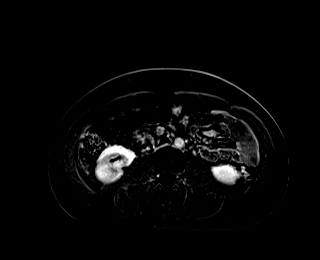
[im 36/72]
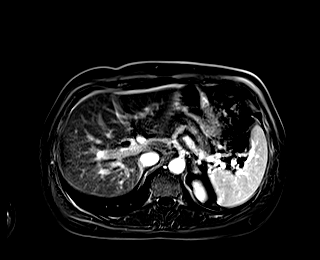
[im 72/72]
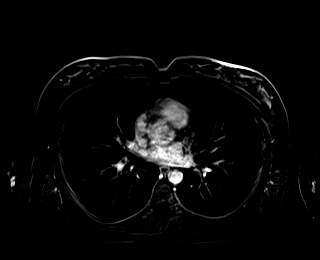

[43 of 48 positions shown; findings below may reference images not displayed]

FINDINGS: Study is limited due to motion.

Lower chest: No acute findings.

Hepatobiliary: Liver is normal in size and contour with no
suspicious mass identified. Subcentimeter cyst at the dome of the
right hepatic lobe. No evidence of hepatic steatosis. Gallbladder is
surgically absent. Grossly stable prominence of the biliary ducts,
the common bile duct measures 9 mm in diameter proximally. No
filling defects visualized in the CBD, given significantly limited
MRCP images secondary to motion.

Pancreas: No mass, inflammatory changes, or other parenchymal
abnormality identified.

Spleen:  Within normal limits in size and appearance.

Adrenals/Urinary Tract: No masses identified. No evidence of
hydronephrosis.

Stomach/Bowel: Visualized portions within the abdomen are
unremarkable.

Vascular/Lymphatic: No pathologically enlarged lymph nodes
identified. No abdominal aortic aneurysm demonstrated.

Other:  No ascites.

Musculoskeletal: No suspicious bone lesions identified.
IMPRESSION: No acute process identified. Grossly stable prominence of the
biliary ducts which is likely compensatory from cholecystectomy.

## 2023-07-02 ENCOUNTER — Ambulatory Visit: Payer: BC Managed Care – PPO | Admitting: Neurology

## 2023-07-02 ENCOUNTER — Encounter: Payer: Self-pay | Admitting: Neurology

## 2023-07-02 VITALS — BP 117/83 | HR 114 | Resp 15 | Ht 60.0 in | Wt 120.0 lb

## 2023-07-02 DIAGNOSIS — G43709 Chronic migraine without aura, not intractable, without status migrainosus: Secondary | ICD-10-CM

## 2023-07-02 DIAGNOSIS — R4701 Aphasia: Secondary | ICD-10-CM | POA: Diagnosis not present

## 2023-07-02 DIAGNOSIS — R42 Dizziness and giddiness: Secondary | ICD-10-CM | POA: Diagnosis not present

## 2023-07-02 DIAGNOSIS — R519 Headache, unspecified: Secondary | ICD-10-CM | POA: Diagnosis not present

## 2023-07-02 DIAGNOSIS — R29818 Other symptoms and signs involving the nervous system: Secondary | ICD-10-CM

## 2023-07-02 MED ORDER — ELETRIPTAN HYDROBROMIDE 40 MG PO TABS: 40.0000 mg | ORAL_TABLET | ORAL | 5 refills | Status: AC | PRN

## 2023-07-02 MED ORDER — AJOVY 225 MG/1.5ML ~~LOC~~ SOAJ: SUBCUTANEOUS | 11 refills | Status: DC

## 2023-07-02 NOTE — Progress Notes (Signed)
 [facility]  PATIENT: Amanda Mullen DOB: 09/24/1980  REFERRING DOCTOR OR PCP: Debbora Presto, NP SOURCE: Patient, notes from primary care, laboratory results  _________________________________   HISTORICAL  CHIEF COMPLAINT:  Chief Complaint  Patient presents with   New Patient (Initial Visit)    Rm11, alone, NP/referral from [facility] NP [phone]/speech disturbance:stumbles over words (APHASIA) ongoing progressively worse in past year. headaches:daily, worse at times, triggers:elevated sugar, lack of eating, screentime. Tinnitus: started in right but now does in left (sounds like a tone most of the time but occasionally sounds like under h20), dizziness (orthostatic bp completed)    HISTORY OF PRESENT ILLNESS:   She is a 43 yo woman who has had chronic daily migraine.  These occur 30/30 days > 4 hours. She has daily mild to moderate pain and severe pain 15-18 days a month.     Pain is worse near the vertex..  Pain starts out sharps and then more burning.    She gets nausea but only rare vomiting.  She takes nausea medication.    When pain is more severe she has photophobia and phonophobia.     When the milder pain starts, she takes Excedrin or Midol which reduces the intensity.  She ends up taking one or both daily.   She takes ondansetron if she has nausea.   She was prescribed rizatriptan and it helps the migraine sometimes but seems to be less effective the past few months.  She has had speech issues initially just had trouble pronouncing words.  However, this seems worse the past fww months.   If she is tired or has a drink, she is worse.  She also has had some word confusion with emails at work.    Her husband has also note this.    These are mostly independent of her migraines.      She has not had any imaging studies  She gets dizzy with certain positions lie bending over and getting back up.   She notes this a lot at the gym.  A  typical spell is a few minutes and she is fine after sitting a short while.    Some spells are not associated with change in position  She takes propranolol low dose before public speaking.  She tried propranolol for migraine prophylaxis but had hypotension.   Amitriptyline for migraine prophylaxis caused cognitive issues.      She had cervical fusion at C4C5 (?) in 2015.    REVIEW OF SYSTEMS: Constitutional: No fevers, chills, sweats, or change in appetite Eyes: No visual changes, double vision, eye pain Ear, nose and throat: No hearing loss, ear pain, nasal congestion, sore throat Cardiovascular: No chest pain, palpitations Respiratory:  No shortness of breath at rest or with exertion.   No wheezes GastrointestinaI: No nausea, vomiting, diarrhea, abdominal pain, fecal incontinence Genitourinary:  No dysuria, urinary retention or frequency.  No nocturia. Musculoskeletal:  No neck pain, back pain Integumentary: No rash, pruritus, skin lesions Neurological: as above Psychiatric: No depression at this time.  No anxiety Endocrine: No palpitations, diaphoresis, change in appetite, change in weigh or increased thirst Hematologic/Lymphatic:  No anemia, purpura, petechiae. Allergic/Immunologic: No itchy/runny eyes, nasal congestion, recent allergic reactions, rashes  ALLERGIES: Allergies  Allergen Reactions   Penicillins Other (See Comments)    Unknown reaction    Codeine     Caused pain in ribs.   Bactrim [Sulfamethoxazole-Trimethoprim] Rash   Ciprofloxacin Rash    HOME MEDICATIONS:  Current Outpatient Medications:    Acetaminophen (MIDOL PO), Take 2 tablets by mouth every 6 (six) hours as needed (headaches)., Disp: , Rfl:    colestipol (COLESTID) 1 g tablet, Take 2 tablets (2 g total) by mouth daily., Disp: 180 tablet, Rfl: 3   eletriptan (RELPAX) 40 MG tablet, Take 1 tablet (40 mg total) by mouth as needed for migraine or headache. May repeat in 2 hours if headache persists or  recurs., Disp: 10 tablet, Rfl: 5   Fremanezumab-vfrm (AJOVY) 225 MG/1.5ML SOAJ, One pen subcutaneously every 4 weeks, Disp: 1.68 mL, Rfl: 11   hyoscyamine (LEVBID) 0.375 MG 12 hr tablet, TAKE 1 TABLET BY MOUTH EVERY 12 HOURS AS NEEDED (ABDOMINAL  PAIN), Disp: 180 tablet, Rfl: 1   ondansetron (ZOFRAN-ODT) 4 MG disintegrating tablet, DISSOLVE 1 TABLET IN MOUTH EVERY 8 HOURS AS NEEDED FOR NAUSEA FOR VOMITING, Disp: 60 tablet, Rfl: 3   Probiotic Product (PROBIOTIC PO), Take 1 capsule by mouth daily., Disp: , Rfl:    propranolol (INDERAL) 20 MG tablet, Take 20 mg by mouth daily as needed (anxiety)., Disp: , Rfl:    rizatriptan (MAXALT-MLT) 10 MG disintegrating tablet, Take 10 mg by mouth as needed for migraine. May repeat in 2 hours if needed, Disp: , Rfl:   PAST MEDICAL HISTORY: Past Medical History:  Diagnosis Date   Migraine    N&V (nausea and vomiting) 07/20/2020    PAST SURGICAL HISTORY: Past Surgical History:  Procedure Laterality Date   BIOPSY  07/26/2020   Procedure: BIOPSY;  Surgeon: Dolores Frame, MD;  Location: [facility];  Service: Gastroenterology;;   CERVICAL SPINE SURGERY     CESAREAN SECTION     CHOLECYSTECTOMY     COLONOSCOPY WITH PROPOFOL N/A 07/26/2020   Procedure: COLONOSCOPY WITH PROPOFOL;  Surgeon: Dolores Frame, MD;  Location: [facility];  Service: Gastroenterology;  Laterality: N/A;   ESOPHAGOGASTRODUODENOSCOPY (EGD) WITH PROPOFOL N/A 07/26/2020   Procedure: ESOPHAGOGASTRODUODENOSCOPY (EGD) WITH PROPOFOL;  Surgeon: Dolores Frame, MD;  Location: [facility];  Service: Gastroenterology;  Laterality: N/A;  PM   tubaligation     Tubes tied 2006    FAMILY HISTORY: Family History  Problem Relation Age of Onset   Ovarian cancer Mother    Cervical cancer Mother    Kidney disease Father    Colon cancer Father    Diabetes Father    Healthy Brother    Healthy Brother     SOCIAL HISTORY: Social History   Socioeconomic  History   Marital status: Married    Spouse name: Not on file   Number of children: Not on file   Years of education: Not on file   Highest education level: Not on file  Occupational History   Not on file  Tobacco Use   Smoking status: Never    Passive exposure: Never   Smokeless tobacco: Never  Vaping Use   Vaping status: Never Used  Substance and Sexual Activity   Alcohol use: Yes    Comment: occasionally   Drug use: Never   Sexual activity: Not on file  Other Topics Concern   Not on file  Social History Narrative   Not on file   Social Drivers of Health   Financial Resource Strain: Not on file  Food Insecurity: Not on file  Transportation Needs: Not on file  Physical Activity: Not on file  Stress: Not on file  Social Connections: Not on file  Intimate Partner Violence: Not At Risk (  07/25/2022)   Received from [facility]   Humiliation, Afraid, Rape, and Kick questionnaire    Fear of Current or Ex-Partner: No    Emotionally Abused: No    Physically Abused: No    Sexually Abused: No       PHYSICAL EXAM  Vitals:   07/02/23 1029 07/02/23 1030 07/02/23 1031  BP: 120/81 121/81 117/83  Pulse: (!) 112 (!) 102 (!) 114  Resp: 15    Weight: 120 lb (54.4 kg)    Height: 5' (1.524 m)      Body mass index is 23.44 kg/m.   General: The patient is well-developed and well-nourished and in no acute distress  HEENT:  Head is Crowder/AT.  Sclera are anicteric.  Funduscopic exam shows normal optic discs and retinal vessels.  Neck: No carotid bruits are noted.  The neck is nontender.  Cardiovascular: The heart has a regular rate and rhythm with a normal S1 and S2. There were no murmurs, gallops or rubs.    Skin: Extremities are without rash or  edema.  Musculoskeletal:  Back is nontender  Neurologic Exam  Mental status: The patient is alert and oriented x 3 at the time of the examination. The patient has apparent normal recent and remote memory, with an apparently  normal attention span and concentration ability.   Speech is normal.  Cranial nerves: Extraocular movements are full. Pupils are equal, round, and reactive to light and accomodation.  Visual fields are full.  Facial symmetry is present. There is good facial sensation to soft touch bilaterally.Facial strength is normal.  Trapezius and sternocleidomastoid strength is normal. No dysarthria is noted.  The tongue is midline, and the patient has symmetric elevation of the soft palate. No obvious hearing deficits are noted.  Motor:  Muscle bulk is normal.   Tone is normal. Strength is  5 / 5 in all 4 extremities.   Sensory: Sensory testing is intact to pinprick, soft touch and vibration sensation in all 4 extremities.  Coordination: Cerebellar testing reveals good finger-nose-finger and heel-to-shin bilaterally.  Gait and station: Station is normal.   Gait is normal. Tandem gait is normal. Romberg is negative.   Reflexes: Deep tendon reflexes are symmetric and normal bilaterally.   Plantar responses are flexor.    DIAGNOSTIC DATA (LABS, IMAGING, TESTING) - I reviewed patient records, labs, notes, testing and imaging myself where available.  Lab Results  Component Value Date   WBC 5.3 05/30/2022   HGB 15.1 (H) 05/30/2022   HCT 46.3 (H) 05/30/2022   MCV 94.9 05/30/2022   PLT 326 05/30/2022      Component Value Date/Time   NA 136 05/30/2022 1314   K 3.7 05/30/2022 1314   CL 100 05/30/2022 1314   CO2 27 05/30/2022 1314   GLUCOSE 85 05/30/2022 1314   BUN 10 05/30/2022 1314   CREATININE 0.60 05/30/2022 1314   CREATININE 0.69 10/26/2020 1007   CALCIUM 9.4 05/30/2022 1314   PROT 8.1 05/30/2022 1314   ALBUMIN 4.7 05/30/2022 1314   AST 27 05/30/2022 1314   ALT 34 05/30/2022 1314   ALKPHOS 57 05/30/2022 1314   BILITOT 2.3 (H) 05/30/2022 1314   GFRNONAA >60 05/30/2022 1314       ASSESSMENT AND PLAN  Headache with neurologic deficit - Plan: MR BRAIN W WO CONTRAST  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 Croston is a 43 year old woman with frequent headaches who more recently has had issues with word finding/aphasia and dizziness.  On examination, she had mild tenderness over the occiput but was otherwise normal.  She appears to have a combination of chronic tension type headaches as well as chronic migraine.  Her migraines occur more than 18 days a month.  She has not responded well to beta-blockers and had been on a tricyclic in the past.  Therefore, I will have her try Ajovy 225 mg subcutaneously every month.  I gave her a sample and sent to the prescription.  If headaches do not improve with this consider other medications.  Additionally, I will have her try eletriptan instead of rizatriptan to see if it performs better for her.  I would also consider one of the anti-CGRP pills based on results.  Additionally because she is having neurologic symptoms such as word finding/aphasia and dizziness on top of the headaches, we will also check an MRI of the brain to determine if there is any superimposed pathology.  She will return to see me in 4 to 6 months or sooner for new or worsening neurologic symptoms.    Thank you for asking to see Ms. Salais.  Please let me know if I be of further assistance with her or other patients in the future.  Pippa Hanif A. Epimenio Foot, MD, [facility] 07/02/2023, 12:57 PM Certified in Neurology, Clinical Neurophysiology, Sleep Medicine and Neuroimaging  [facility] [address] [place], [place] [address] [phone]

## 2023-07-03 ENCOUNTER — Telehealth: Payer: Self-pay

## 2023-07-03 ENCOUNTER — Other Ambulatory Visit (HOSPITAL_COMMUNITY): Payer: Self-pay

## 2023-07-03 NOTE — Telephone Encounter (Signed)
 Pharmacy Patient Advocate Encounter   Received notification from Fax that prior authorization for AJOVY (fremanezumab-vfrm) injection 225MG /1.5ML auto-injectors is required/requested.   Insurance verification completed.   The patient is insured through [employer] .   Per test claim: PA required; PA submitted to above mentioned insurance via CoverMyMeds Key/confirmation #/EOC [facility] Status is pending

## 2023-07-03 NOTE — Telephone Encounter (Signed)
 Pharmacy Patient Advocate Encounter  Received notification from  [employer]  that Prior Authorization for AJOVY (fremanezumab-vfrm) injection 225MG /1.5ML auto-injectors has been DENIED.  Full denial letter will be uploaded to the media tab. See denial reason below.   PA #/Case ID/Reference #: PA Case ID #: [id number]

## 2023-07-08 ENCOUNTER — Encounter: Payer: Self-pay | Admitting: Neurology

## 2023-07-08 NOTE — Telephone Encounter (Signed)
 Pt was seen on 07/02/23 MRI order was placed.

## 2023-07-13 ENCOUNTER — Other Ambulatory Visit: Payer: Self-pay | Admitting: Neurology

## 2023-07-13 MED ORDER — TOPIRAMATE 50 MG PO TABS: ORAL_TABLET | ORAL | 5 refills | Status: AC

## 2023-07-14 NOTE — Addendum Note (Signed)
 Addended by: Arther Abbott on: 07/14/2023 07:24 AM   Modules accepted: Orders

## 2023-07-14 NOTE — Telephone Encounter (Signed)
 pending faxed notes case #[id number] for [facility]. Fax # [phone]

## 2023-07-15 NOTE — Telephone Encounter (Signed)
 [employer]: [phone] exp. 07/14/23-10/13/23 sent to [facility] [phone]

## 2023-07-25 ENCOUNTER — Ambulatory Visit (HOSPITAL_COMMUNITY)
Admission: RE | Admit: 2023-07-25 | Discharge: 2023-07-25 | Disposition: A | Discharge status: Home / Self Care | Source: Ambulatory Visit | Attending: Neurology | Admitting: Neurology

## 2023-07-25 DIAGNOSIS — R29818 Other symptoms and signs involving the nervous system: Secondary | ICD-10-CM | POA: Diagnosis present

## 2023-07-25 DIAGNOSIS — R42 Dizziness and giddiness: Secondary | ICD-10-CM | POA: Diagnosis present

## 2023-07-25 DIAGNOSIS — R519 Headache, unspecified: Secondary | ICD-10-CM | POA: Diagnosis present

## 2023-07-25 DIAGNOSIS — R4701 Aphasia: Secondary | ICD-10-CM | POA: Diagnosis present

## 2023-07-25 MED ORDER — GADOBUTROL 1 MMOL/ML IV SOLN
5.5000 mL | Freq: Once | INTRAVENOUS | Status: AC | PRN
  Administered 2023-07-25: 5.5 mL via INTRAVENOUS

## 2023-07-28 ENCOUNTER — Encounter: Payer: Self-pay | Admitting: Neurology

## 2024-01-28 ENCOUNTER — Encounter (INDEPENDENT_AMBULATORY_CARE_PROVIDER_SITE_OTHER): Payer: Self-pay | Admitting: Gastroenterology

## 2024-02-02 ENCOUNTER — Encounter: Payer: Self-pay | Admitting: Neurology

## 2024-02-02 NOTE — Telephone Encounter (Signed)
 Called and LVM for pt to call back and let us  know if she will be ok with 12/1 @1 :30pm. Please offer if pt calls back. Appt is on hold for her.

## 2024-02-03 ENCOUNTER — Ambulatory Visit: Admitting: Neurology

## 2024-03-15 ENCOUNTER — Encounter: Payer: Self-pay | Admitting: Neurology

## 2024-03-15 ENCOUNTER — Ambulatory Visit: Admitting: Neurology

## 2024-03-15 NOTE — Telephone Encounter (Signed)
 Called and LVM for pt to call back and r/s.

## 2024-05-10 NOTE — Progress Notes (Unsigned)
 "  Office Visit Note  Patient: Amanda Mullen             Date of Birth: [birth year]           MRN: [id number]             PCP: Zita Reges, MD Referring: Zita Reges, MD Visit Date: 05/13/2024 Occupation: Data Unavailable  Subjective:  Positive ANA   History of Present Illness: Amanda Mullen is a 44 y.o. female who present today for a new patient consultation.   Patient reports that she has been experiencing significant fatigue for the past few years which has progressively been worsening for the past 2 years.  Patient states that she was previously able to exercise 6 days a week and is now only able to exercise 1-2 times per week due to the level of fatigue.  Patient states that she sleeps well at night but has had significant daytime drowsiness.  The level of fatigue has been interfering with her quality of life.  Patient states that she has also been experiencing facial rashes which have become daily and more severe.  She has also noticed intermittent urticaria on her upper arms and increased photosensitivity.  Patient states that she was previously diagnosed with raynaud's affecting her fingertips and toes in her late 53s.  Patient states that the symptoms have been more severe and is unsure if it is related to cooler weather temperatures or since being started on propranolol.  Patient states this past summer she noticed hair loss but states that she has now noticed some new hair growth.  She has been experiencing intermittent discomfort and stiffness affecting the right shoulder, left wrist, both hands, right hip, both ankles, and both feet.  Patient describes the pain as an aching sensation.  She wakes up in the middle of the night with pain in her ankles and toes.  She has not noticed any visible swelling.  Patient states that she has had intermittent canker sores but denies any nasal ulcerations.  She has noticed some eye dryness and dryness in the nose. Patient states that in the past  she established care with gastroenterology.  Patient states that she has been previously diagnosed with pancreatitis as well as GERD/gastritis.  She is currently on medications to help manage chronic diarrhea.  Patient states that she has also been evaluated by neurologist in the past due to brain fog and word searching.  Patient states that the workup was unremarkable. Patient states that she has also previously been evaluated by cardiologist due to episodes of her heart racing.  Patient has been started on propranolol which has helped to manage these episodes of tachycardia.  She denies any personal or family history of uveitis, Crohn's disease, ulcerative colitis, psoriasis, or MS. Patient states that her maternal grandmother has been diagnosed with lupus and her mother has fibromyalgia. Denies any blood clots.   Activities of Daily Living:  Patient reports morning stiffness for 1-2 hours.   Patient Reports nocturnal pain.  Difficulty dressing/grooming: Denies Difficulty climbing stairs: Denies Difficulty getting out of chair: Denies Difficulty using hands for taps, buttons, cutlery, and/or writing: Reports  Review of Systems  Constitutional:  Positive for fatigue.  HENT:  Positive for mouth sores and mouth dryness.   Eyes:  Positive for dryness.  Respiratory:  Negative for shortness of breath.   Cardiovascular:  Positive for chest pain and palpitations.  Gastrointestinal:  Positive for blood in stool and diarrhea. Negative for constipation.  Endocrine:  Positive for increased urination.  Genitourinary:  Negative for involuntary urination.  Musculoskeletal:  Positive for joint pain, gait problem, joint pain, myalgias, morning stiffness, muscle tenderness and myalgias. Negative for joint swelling and muscle weakness.  Skin:  Positive for rash and sensitivity to sunlight. Negative for color change and hair loss.  Allergic/Immunologic: Positive for susceptible to infections.  Neurological:   Positive for dizziness and headaches.  Hematological:  Negative for swollen glands.  Psychiatric/Behavioral:  Negative for depressed mood and sleep disturbance. The patient is not nervous/anxious.     PMFS History:  Patient Active Problem List   Diagnosis Date Noted   Abnormal LFTs 07/10/2021   Diarrhea 10/26/2020   N&V (nausea and vomiting) 07/20/2020   Upper abdominal pain 07/20/2020   Family history of colon cancer 07/20/2020   Jaundice 07/20/2020    Past Medical History:  Diagnosis Date   Migraine    N&V (nausea and vomiting) 07/20/2020   Raynaud's disease     Family History  Problem Relation Age of Onset   Ovarian cancer Mother    Cervical cancer Mother    Kidney disease Father    Colon cancer Father    Diabetes Father    Healthy Brother    Healthy Brother    Past Surgical History:  Procedure Laterality Date   BIOPSY  07/26/2020   Procedure: BIOPSY;  Surgeon: Eartha Angelia Sieving, MD;  Location: [facility];  Service: Gastroenterology;;   CERVICAL SPINE SURGERY     CESAREAN SECTION     CHOLECYSTECTOMY     COLONOSCOPY WITH PROPOFOL  N/A 07/26/2020   Procedure: COLONOSCOPY WITH PROPOFOL ;  Surgeon: Eartha Angelia Sieving, MD;  Location: [facility];  Service: Gastroenterology;  Laterality: N/A;   ESOPHAGOGASTRODUODENOSCOPY (EGD) WITH PROPOFOL  N/A 07/26/2020   Procedure: ESOPHAGOGASTRODUODENOSCOPY (EGD) WITH PROPOFOL ;  Surgeon: Eartha Angelia Sieving, MD;  Location: [facility];  Service: Gastroenterology;  Laterality: N/A;  PM   tubaligation     Tubes tied 2006   Social History[1] Social History   Social History Narrative   Not on file     Immunization History  Administered Date(s) Administered   Moderna Sars-Covid-2 Vaccination 07/22/2019, 08/19/2019     Objective: Vital Signs: BP 122/79 (BP Location: Right Arm, Patient Position: Sitting, Cuff Size: Normal)   Pulse 79   Temp 98.3 F (36.8 C)   Resp 14   Ht 5' 0.5 (1.537 m)   Wt 117 lb  6.4 oz (53.3 kg)   BMI 22.55 kg/m    Physical Exam Vitals and nursing note reviewed.  Constitutional:      Appearance: She is well-developed.  HENT:     Head: Normocephalic and atraumatic.  Eyes:     Conjunctiva/sclera: Conjunctivae normal.  Cardiovascular:     Rate and Rhythm: Normal rate and regular rhythm.     Heart sounds: Normal heart sounds.  Pulmonary:     Effort: Pulmonary effort is normal.     Breath sounds: Normal breath sounds.  Abdominal:     General: Bowel sounds are normal.     Palpations: Abdomen is soft.  Musculoskeletal:     Cervical back: Normal range of motion.  Lymphadenopathy:     Cervical: No cervical adenopathy.  Skin:    General: Skin is warm and dry.     Capillary Refill: Capillary refill takes less than 2 seconds.  Neurological:     Mental Status: She is alert and oriented to person, place, and time.  Psychiatric:  Behavior: Behavior normal.      Musculoskeletal Exam: C-spine, thoracic spine, lumbar spine have good range of motion.  No midline spinal tenderness.  No SI joint tenderness.  Shoulder joints has good range of motion.  Tenderness of the right shoulder.  Elbow joints have good range of motion with no tenderness along the joint line.  Tenderness along the ulnar aspect of the left wrist. MCPs, PIPs, DIPs have good range of motion with no synovitis.  Tenderness of MCP joints.  Complete fist formation bilaterally.  Hip joints have good range of motion with no groin pain.  Knee joints have good range of motion no warmth or effusion.  Tenderness along the ankle joint line bilaterally.  Tenderness across all MTP joints.  No evidence of Achilles tendinitis or plantar fasciitis.   CDAI Exam: CDAI Score: -- Patient Global: --; Provider Global: -- Swollen: --; Tender: -- Joint Exam 05/13/2024   No joint exam has been documented for this visit   There is currently no information documented on the homunculus. Go to the Rheumatology activity  and complete the homunculus joint exam.  Investigation: No additional findings.  Imaging: No results found.  Recent Labs: Lab Results  Component Value Date   WBC 5.3 05/30/2022   HGB 15.1 (H) 05/30/2022   PLT 326 05/30/2022   NA 136 05/30/2022   K 3.7 05/30/2022   CL 100 05/30/2022   CO2 27 05/30/2022   GLUCOSE 85 05/30/2022   BUN 10 05/30/2022   CREATININE 0.60 05/30/2022   BILITOT 2.3 (H) 05/30/2022   ALKPHOS 57 05/30/2022   AST 27 05/30/2022   ALT 34 05/30/2022   PROT 8.1 05/30/2022   ALBUMIN 4.7 05/30/2022   CALCIUM 9.4 05/30/2022    Speciality Comments: No specialty comments available.  Procedures:  No procedures performed Allergies: Penicillins, Alpha-d-galactosidase, Codeine, Ciprofloxacin, and Sulfamethoxazole-trimethoprim    Assessment / Plan:     Visit Diagnoses: Positive ANA (antinuclear antibody) -  01/23/24: ANA 1: 160 NS, ribosomal P-, centromere ab-, Ro-, La-, Scl-70-, Jo-1 ab-, dsDNA-, Sm/RNP-, RNP-,SM-, chromatin, RF, ESR 2, Maternal grandmother-lupus,  Chronic fatigue, facial rashes (photos provided), Raynaud's phenomenon (photos provided), photosensitivity, arthralgias and joint stiffness, dry eyes, history of hair loss:  Patient's primary concern remains the level of fatigue she experiences on a daily basis.  She has also had increased arthralgias and joint stiffness.  She experiences nocturnal pain in both ankle joints and both toes and describes it as an aching sensation.  She has tenderness across MCP joints, both ankles, and all MTP joints. Patient brought photo evidence of Raynaud's phenomenon affecting the fingertips as well as facial rashes sparing the nasolabial fold.  She has had chronic eye dryness, intermittent photosensitivity, and occasionally urticaria.  Patient was found to have a positive ANA 1: 160 NS-rest of ENA panel was negative, RF negative, and ESR was 2 on 01/23/2024.  Plan to obtain the following lab work for further  evaluation. Discussed initiating a trial of Plaquenil due to the chronicity and severity of symptoms.  Reviewed indications, contraindications, and potential side effects of Plaquenil today in detail.  All questions were addressed and consent was obtained.  Plan to send a prescription for Plaquenil 200 mg 1 tablet by mouth daily pending CBC and CMP results today.  She will notify us  if she cannot tolerate taking Plaquenil.  She will follow-up in the office in 6 weeks or sooner if needed. - Plan: Cyclic citrul peptide antibody, IgG, Sedimentation rate, C-reactive protein,  Anti-scleroderma antibody, ANA, RNP Antibody, Anti-DNA antibody, double-stranded, C3 and C4, Beta-2 glycoprotein antibodies, Cardiolipin antibodies, IgG, IgM, IgA, Comprehensive metabolic panel with GFR, CBC with Differential/Platelet, Urinalysis, Routine w reflex microscopic  Patient was counseled on the purpose, proper use, and adverse effects of hydroxychloroquine including nausea/diarrhea, skin rash, headaches, and sun sensitivity.  Advised patient to wear sunscreen once starting hydroxychloroquine to reduce risk of rash associated with sun sensitivity.  Discussed importance of annual eye exams while on hydroxychloroquine to monitor to ocular toxicity and discussed importance of frequent laboratory monitoring.  Provided patient with eye exam form for baseline ophthalmologic exam.  Provided patient with educational materials on hydroxychloroquine and answered all questions.  Patient consented to hydroxychloroquine. Will upload consent in the media tab.    Hydroxychloroquine dose will be 200 mg once daily..  Prescription pending lab results.  High risk medication use - Plan to initiate a trial of Plaquenil 200 mg 1 tablet by mouth daily.   Plan to check CBC and CMP today.  Prescription pending lab results.  Patient has an upcoming appointment scheduled with ophthalmology on 05/25/2024--patient was given a Plaquenil examination form to  take with her to her upcoming appointment.  She can call to clarify if she can have the OCT testing performed at the upcoming visit.  Plan: Comprehensive metabolic panel with GFR, CBC with Differential/Platelet  Pain in both hands - RF-: Patient presents today with tenderness across the MCP joints and of the left wrist joint.  She has been experiencing episode of left wrist pain lasting several days at a time and then self resolving.  No obvious synovitis was noted on examination today.  X-rays of both hands were obtained for further evaluation.  Plan: XR Hand 2 View Right, XR Hand 2 View Left  Pain in both feet - Chronic pain both ankles and both feet.  Tenderness along the ankle joint line bilaterally.  Tenderness of MTP joints.  She has been experiencing nocturnal pain affecting both feet.  ESR WNL-2 on 01/23/24. Plan to obtain X-rays and the following lab work today.  Plan: XR Foot 2 Views Left, XR Foot 2 Views Right  Raynaud's syndrome without gangrene - Late 20s-Photos provided.  Patient has noticed a progressively worsening symptoms of raynaud's.  Discussed that beta-blocker such as propranolol can worsen symptoms of Raynaud's phenomenon.   She was given an informational handout about raynaud's to review.  No signs of sclerodactyly.  No digital ulcerations noted.  Discussed the importance of keeping the core body temperature warm as well as wearing gloves and socks.- Plan: Anti-scleroderma antibody, ANA, RNP Antibody  Facial rash - Positive ANA: She has been experiencing daily facial erythema --photos were provided concerning for a possible Maller rash--erythema spares the nasolabial folds. Plan to obtain the following lab work for further evaluation.  Plan: Cyclic citrul peptide antibody, IgG, Sedimentation rate, C-reactive protein, Anti-scleroderma antibody, ANA, RNP Antibody, Anti-DNA antibody, double-stranded, C3 and C4, Beta-2 glycoprotein antibodies, Cardiolipin antibodies, IgG, IgM, IgA,  Comprehensive metabolic panel with GFR, CBC with Differential/Platelet, Urinalysis, Routine w reflex microscopic  Other fatigue - She has been experiencing longstanding fatigue over the past several years which has progressively been worsening over the past 2 years.  She has been having to sleep more than usual and has had to limit her exercise regimen due to severity of fatigue.   Plan to obtain the following lab work for further evaluation.   Plan to consider a trial of plaquenil as discussed above.  Plan: Cyclic citrul peptide antibody, IgG, Sedimentation rate, C-reactive protein, Anti-scleroderma antibody, ANA, RNP Antibody, Anti-DNA antibody, double-stranded, C3 and C4, Beta-2 glycoprotein antibodies, Cardiolipin antibodies, IgG, IgM, IgA, Comprehensive metabolic panel with GFR, CBC with Differential/Platelet, Urinalysis, Routine w reflex microscopic  Abnormal LFTs -AST and ALT WNL on 01/23/24.  CMP updated today.  Plan: Comprehensive metabolic panel with GFR  Other medical conditions are listed as follows:   History of kidney stones - Age 77, Recurrent  Family history of colon cancer  Sinus tachycardia - Propranolol  History of pancreatitis  History of gastroesophageal reflux (GERD)  Hx of migraines  History of gestational diabetes Orders: Orders Placed This Encounter  Procedures   XR Hand 2 View Right   XR Hand 2 View Left   XR Foot 2 Views Left   XR Foot 2 Views Right   Cyclic citrul peptide antibody, IgG   Sedimentation rate   C-reactive protein   Anti-scleroderma antibody   ANA   RNP Antibody   Anti-DNA antibody, double-stranded   C3 and C4   Beta-2 glycoprotein antibodies   Cardiolipin antibodies, IgG, IgM, IgA   Comprehensive metabolic panel with GFR   CBC with Differential/Platelet   Urinalysis, Routine w reflex microscopic   No orders of the defined types were placed in this encounter.    Follow-Up Instructions: Return in about 6 weeks (around  06/24/2024).   Waddell CHRISTELLA Craze, PA-C  Note - This record has been created using Dragon software.  Chart creation errors have been sought, but may not always  have been located. Such creation errors do not reflect on  the standard of medical care.     [1]  Social History Tobacco Use   Smoking status: Never    Passive exposure: Never   Smokeless tobacco: Never  Vaping Use   Vaping status: Never Used  Substance Use Topics   Alcohol use: Yes    Comment: occasionally   Drug use: Never   "

## 2024-05-13 ENCOUNTER — Ambulatory Visit

## 2024-05-13 ENCOUNTER — Encounter: Payer: Self-pay | Admitting: Physician Assistant

## 2024-05-13 ENCOUNTER — Ambulatory Visit: Admitting: Physician Assistant

## 2024-05-13 VITALS — BP 122/79 | HR 79 | Temp 98.3°F | Resp 14 | Ht 60.5 in | Wt 117.4 lb

## 2024-05-13 DIAGNOSIS — R7689 Other specified abnormal immunological findings in serum: Secondary | ICD-10-CM

## 2024-05-13 DIAGNOSIS — Z8669 Personal history of other diseases of the nervous system and sense organs: Secondary | ICD-10-CM

## 2024-05-13 DIAGNOSIS — R7989 Other specified abnormal findings of blood chemistry: Secondary | ICD-10-CM

## 2024-05-13 DIAGNOSIS — Z800 Family history of malignant neoplasm of digestive organs: Secondary | ICD-10-CM

## 2024-05-13 DIAGNOSIS — Z8719 Personal history of other diseases of the digestive system: Secondary | ICD-10-CM

## 2024-05-13 DIAGNOSIS — M79641 Pain in right hand: Secondary | ICD-10-CM

## 2024-05-13 DIAGNOSIS — Z87442 Personal history of urinary calculi: Secondary | ICD-10-CM

## 2024-05-13 DIAGNOSIS — M79642 Pain in left hand: Secondary | ICD-10-CM

## 2024-05-13 DIAGNOSIS — M79671 Pain in right foot: Secondary | ICD-10-CM

## 2024-05-13 DIAGNOSIS — I7300 Raynaud's syndrome without gangrene: Secondary | ICD-10-CM | POA: Diagnosis not present

## 2024-05-13 DIAGNOSIS — R000 Tachycardia, unspecified: Secondary | ICD-10-CM

## 2024-05-13 DIAGNOSIS — M79672 Pain in left foot: Secondary | ICD-10-CM

## 2024-05-13 DIAGNOSIS — Z79899 Other long term (current) drug therapy: Secondary | ICD-10-CM

## 2024-05-13 DIAGNOSIS — R5383 Other fatigue: Secondary | ICD-10-CM

## 2024-05-13 DIAGNOSIS — Z8632 Personal history of gestational diabetes: Secondary | ICD-10-CM

## 2024-05-13 DIAGNOSIS — R21 Rash and other nonspecific skin eruption: Secondary | ICD-10-CM | POA: Diagnosis not present

## 2024-05-13 NOTE — Patient Instructions (Addendum)
 Raynaud's Phenomenon  Raynaud's phenomenon is a condition that affects the blood vessels (arteries) that carry blood to the fingers and toes. The arteries that supply blood to the ears, lips, nipples, or the tip of the nose might also be affected. Raynaud's phenomenon causes the arteries to become narrow temporarily (spasm). As a result, the flow of blood to the affected areas is temporarily decreased. This usually occurs in response to cold temperatures or stress. During an attack, the skin in the affected areas turns white, then blue, and finally red. A person may also feel tingling or numbness in those areas. Attacks usually last for only a brief period, and then the blood flow to the area returns to normal. In most cases, Raynaud's phenomenon does not cause serious health problems. What are the causes? In many cases, the cause of this condition is not known. The condition may occur on its own (primary Raynaud's phenomenon) or may be associated with other diseases or factors (secondary Raynaud's phenomenon). Possible causes may include: Diseases or medical conditions that damage the arteries. Injuries and repetitive actions that hurt the hands or feet. Being exposed to certain chemicals. Taking medicines that narrow the arteries. Other medical conditions, such as lupus, scleroderma, rheumatoid arthritis, thyroid problems, blood disorders, Sjogren syndrome, or atherosclerosis. What increases the risk? The following factors may make you more likely to develop this condition: Being 28-48 years old. Being female. Having a family history of Raynaud's phenomenon. Living in a cold climate. Smoking. What are the signs or symptoms? Symptoms of this condition usually occur when you are exposed to cold temperatures or when you have emotional stress. The symptoms may last for a few minutes or up to several hours. They usually affect your fingers but may also affect your toes, nipples, lips, ears, or the  tip of your nose. Symptoms may include: Changes in skin color. The skin in the affected areas will turn pale or white. The skin may then change from white to bluish to red as normal blood flow returns to the area. Numbness, tingling, or pain in the affected areas. In severe cases, symptoms may include: Skin sores. Tissues decaying and dying (gangrene). How is this diagnosed? This condition may be diagnosed based on: Your symptoms and medical history. A physical exam. During the exam, you may be asked to put your hands in cold water  to check for a reaction to cold temperature. Tests, such as: Blood tests to check for other diseases or conditions. A test to check the movement of blood through your arteries and veins (vascular ultrasound). A test in which the skin at the base of your fingernail is examined under a microscope (nailfold capillaroscopy). How is this treated? During an episode, you can take actions to help symptoms go away faster. Options include moving your arms around in a windmill pattern, warming your fingers under warm water , or placing your fingers in a warm body fold, such as your armpit. Long-term treatment for this condition often involves making lifestyle changes and taking steps to control your exposure to cold temperature. For more severe cases, medicine (calcium channel blockers) may be used to improve blood circulation. Follow these instructions at home: Avoiding cold temperatures Take these steps to avoid exposure to cold: If possible, stay indoors during cold weather. When you go outside during cold weather, dress in layers and wear mittens, a hat, a scarf, and warm footwear. Wear mittens or gloves when handling ice or frozen food. Use holders for glasses or cans containing  cold drinks. Let warm water  run for a while before taking a shower or bath. Warm up the car before driving in cold weather. Lifestyle If possible, avoid stressful and emotional situations. Try  to find ways to manage your stress, such as: Exercise. Yoga. Meditation. Biofeedback. Do not use any products that contain nicotine or tobacco. These products include cigarettes, chewing tobacco, and vaping devices, such as e-cigarettes. If you need help quitting, ask your health care provider. Avoid secondhand smoke. Limit your use of caffeine. Switch to decaffeinated coffee, tea, and soda. Avoid chocolate. Avoid vibrating tools and machinery. General instructions Protect your hands and feet from injuries, cuts, or bruises. Avoid wearing tight rings or wristbands. Wear loose fitting socks and comfortable, roomy shoes. Take over-the-counter and prescription medicines only as told by your health care provider. Where to find support Raynaud's Association: www.raynauds.org Where to find more information [employer] of Arthritis and Musculoskeletal and Skin Diseases: www.niams.http://www.myers.net/ Contact a health care provider if: Your discomfort becomes worse despite lifestyle changes. You develop sores on your fingers or toes that do not heal. You have breaks in the skin on your fingers or toes. You have a fever. You have pain or swelling in your joints. You have a rash. Your symptoms occur on only one side of your body. Get help right away if: Your fingers or toes turn black. You have severe pain in the affected areas. These symptoms may represent a serious problem that is an emergency. Do not wait to see if the symptoms will go away. Get medical help right away. Call your local emergency services (911 in the [place] Do not drive yourself to the hospital. Summary Raynaud's phenomenon is a condition that affects the arteries that carry blood to the fingers, toes, ears, lips, nipples, or the tip of the nose. In many cases, the cause of this condition is not known. Symptoms of this condition include changes in skin color along with numbness and tingling in the affected area. Treatment for  this condition includes lifestyle changes and reducing exposure to cold temperatures. Medicines may be used for severe cases of the condition. Contact your health care provider if your condition worsens despite treatment. This information is not intended to replace advice given to you by your health care provider. Make sure you discuss any questions you have with your health care provider. Document Revised: 06/06/2020 Document Reviewed: 06/06/2020 Elsevier Patient Education  2024 [employer]   Standing Labs We placed an order today for your standing lab work.   Please have your standing labs drawn in 1 month, 3 months, then every 5 months  Please have your labs drawn 2 weeks prior to your appointment so that the provider can discuss your lab results at your appointment, if possible.  Please note that you may see your imaging and lab results in MyChart before we have reviewed them. We will contact you once all results are reviewed. Please allow our office up to 72 hours to thoroughly review all of the results before contacting the office for clarification of your results.  WALK-IN LAB HOURS  Monday through Thursday from 8:00 am - 4:30 pm and Friday from 8:00 am-12:00 pm.  Patients with office visits requiring labs will be seen before walk-in labs.  You may encounter longer than normal wait times. Please allow additional time. Wait times may be shorter on  Monday and Thursday afternoons.  We do not book appointments for walk-in labs. We appreciate your patience and understanding with  our staff.   Labs are drawn by [facility]. Please bring your co-pay at the time of your lab draw.  You may receive a bill from [facility] for your lab work.  Please note if you are on Hydroxychloroquine and and an order has been placed for a Hydroxychloroquine level,  you will need to have it drawn 4 hours or more after your last dose.  If you wish to have your labs drawn at another location, please call the office  24 hours in advance so we can fax the orders.  The office is located at [address], [place], [place] [address]   If you have any questions regarding directions or hours of operation,  please call [phone].   As a reminder, please drink plenty of water  prior to coming for your lab work. Thanks!   Hydroxychloroquine Tablets What is this medication? HYDROXYCHLOROQUINE (hye drox ee KLOR oh kwin) treats autoimmune conditions, such as rheumatoid arthritis and lupus. It works by slowing down an overactive immune system. It may also be used to prevent and treat malaria. It works by killing the parasite that causes malaria. It belongs to a group of medications called DMARDs. This medicine may be used for other purposes; ask your health care provider or pharmacist if you have questions. COMMON BRAND NAME(S): Plaquenil, Quineprox, SOVUNA What should I tell my care team before I take this medication? They need to know if you have any of these conditions: Diabetes Eye disease, vision problems Frequently drink alcohol G6PD deficiency Heart disease Irregular heartbeat or rhythm Kidney disease Liver disease Porphyria Psoriasis An unusual or allergic reaction to hydroxychloroquine, other medications, foods, dyes, or preservatives Pregnant or trying to get pregnant Breastfeeding How should I use this medication? Take this medication by mouth with water . Take it as directed on the prescription label. Do not cut, crush, or chew this medication. Swallow the tablets whole. Take it with food. Do not take it more than directed. Take all of this medication unless your care team tells you to stop it early. Keep taking it even if you think you are better. Take products with antacids in them at a different time of day than this medication. Take this medication 4 hours before or 4 hours after antacids. Talk to your care team if you have questions. Talk to your care team about the use of this  medication in children. While this medication may be prescribed for selected conditions, precautions do apply. Overdosage: If you think you have taken too much of this medicine contact a poison control center or emergency room at once. NOTE: This medicine is only for you. Do not share this medicine with others. What if I miss a dose? If you miss a dose, take it as soon as you can. If it is almost time for your next dose, take only that dose. Do not take double or extra doses. What may interact with this medication? Do not take this medication with any of the following: Cisapride Dronedarone Pimozide Thioridazine This medication may also interact with the following: Ampicillin Antacids Cimetidine Cyclosporine Digoxin Kaolin Medications for diabetes, such as insulin, glipizide, glyburide Medications for seizures, such as carbamazepine, phenobarbital, phenytoin Mefloquine Methotrexate Other medications that cause heart rhythm changes Praziquantel This list may not describe all possible interactions. Give your health care provider a list of all the medicines, herbs, non-prescription drugs, or dietary supplements you use. Also tell them if you smoke, drink alcohol, or use illegal drugs. Some items may  interact with your medicine. What should I watch for while using this medication? Visit your care team for regular checks on your progress. Tell your care team if your symptoms do not start to get better or if they get worse. You may need blood work done while you are taking this medication. If you take other medications that can affect heart rhythm, you may need more testing. Talk to your care team if you have questions. Your vision may be tested before and during use of this medication. Tell your care team right away if you have any change in your eyesight. This medication may cause serious skin reactions. They can happen weeks to months after starting the medication. Contact your care team  right away if you notice fevers or flu-like symptoms with a rash. The rash may be red or purple and then turn into blisters or peeling of the skin. Or, you might notice a red rash with swelling of the face, lips or lymph nodes in your neck or under your arms. If you or your family notice any changes in your behavior, such as new or worsening depression, thoughts of harming yourself, anxiety, or other unusual or disturbing thoughts, or memory loss, call your care team right away. What side effects may I notice from receiving this medication? Side effects that you should report to your care team as soon as possible: Allergic reactions--skin rash, itching, hives, swelling of the face, lips, tongue, or throat Aplastic anemia--unusual weakness or fatigue, dizziness, headache, trouble breathing, increased bleeding or bruising Change in vision Heart rhythm changes--fast or irregular heartbeat, dizziness, feeling faint or lightheaded, chest pain, trouble breathing Infection--fever, chills, cough, or sore throat Low blood sugar (hypoglycemia)--tremors or shaking, anxiety, sweating, cold or clammy skin, confusion, dizziness, rapid heartbeat Muscle injury--unusual weakness or fatigue, muscle pain, dark yellow or brown urine, decrease in amount of urine Pain, tingling, or numbness in the hands or feet Rash, fever, and swollen lymph nodes Redness, blistering, peeling, or loosening of the skin, including inside the mouth Thoughts of suicide or self-harm, worsening mood, or feelings of depression Unusual bruising or bleeding Side effects that usually do not require medical attention (report to your care team if they continue or are bothersome): Diarrhea Headache Nausea Stomach pain Vomiting This list may not describe all possible side effects. Call your doctor for medical advice about side effects. You may report side effects to FDA at 1-800-FDA-1088. Where should I keep my medication? Keep out of the reach  of children and pets. Store at room temperature up to 30 degrees C (86 degrees F). Protect from light. Get rid of any unused medication after the expiration date. To get rid of medications that are no longer needed or have expired: Take the medication to a medication take-back program. Check with your pharmacy or law enforcement to find a location. If you cannot return the medication, check the label or package insert to see if the medication should be thrown out in the garbage or flushed down the toilet. If you are not sure, ask your care team. If it is safe to put it in the trash, empty the medication out of the container. Mix the medication with cat litter, dirt, coffee grounds, or other unwanted substance. Seal the mixture in a bag or container. Put it in the trash. NOTE: This sheet is a summary. It may not cover all possible information. If you have questions about this medicine, talk to your doctor, pharmacist, or health care provider.  2024 Elsevier/Gold Standard (2021-10-08 00:00:00)

## 2024-05-18 ENCOUNTER — Other Ambulatory Visit: Payer: Self-pay

## 2024-05-18 ENCOUNTER — Ambulatory Visit: Payer: Self-pay | Admitting: Physician Assistant

## 2024-05-18 NOTE — Progress Notes (Signed)
 ANA remains positive.   UA revealed trace ketones and hemoglobin.  Few bacteria.  No proteinuria.   Total bilirubin remains elevated, rest of CMP WNL  CBC WNL  CRP WNL Scl-70-,RNP-, dsDNA-,complements WNL, beta-2 glycoprotein antibodies negative, and anticardiolipin antibodies negative

## 2024-05-18 NOTE — Progress Notes (Unsigned)
 Per lab note: Ok to initiate trial of plaquenil  as discussed at fist visit   Per office note on 05/13/2024: Hydroxychloroquine  dose will be 200 mg once daily.   Please review and sign pended PLQ rx. Thanks!   I called patient and left message to advise that prescription will be sent in.

## 2024-05-19 LAB — URINALYSIS, ROUTINE W REFLEX MICROSCOPIC
Bilirubin Urine: NEGATIVE
Glucose, UA: NEGATIVE
Hyaline Cast: NONE SEEN /LPF
Leukocytes,Ua: NEGATIVE
Nitrite: NEGATIVE
Protein, ur: NEGATIVE
Specific Gravity, Urine: 1.024 (ref 1.001–1.035)
pH: 5.5 (ref 5.0–8.0)

## 2024-05-19 LAB — CBC WITH DIFFERENTIAL/PLATELET
Absolute Lymphocytes: 2109 {cells}/uL (ref 850–3900)
Absolute Monocytes: 342 {cells}/uL (ref 200–950)
Basophils Absolute: 63 {cells}/uL (ref 0–200)
Basophils Relative: 1.1 %
Eosinophils Absolute: 160 {cells}/uL (ref 15–500)
Eosinophils Relative: 2.8 %
HCT: 43.7 % (ref 35.9–46.0)
Hemoglobin: 14.6 g/dL (ref 11.7–15.5)
MCH: 31.1 pg (ref 27.0–33.0)
MCHC: 33.4 g/dL (ref 31.6–35.4)
MCV: 93 fL (ref 81.4–101.7)
MPV: 9.8 fL (ref 7.5–12.5)
Monocytes Relative: 6 %
Neutro Abs: 3027 {cells}/uL (ref 1500–7800)
Neutrophils Relative %: 53.1 %
Platelets: 276 10*3/uL (ref 140–400)
RBC: 4.7 Million/uL (ref 3.80–5.10)
RDW: 11.7 % (ref 11.0–15.0)
Total Lymphocyte: 37 %
WBC: 5.7 10*3/uL (ref 3.8–10.8)

## 2024-05-19 LAB — C-REACTIVE PROTEIN: CRP: <3 mg/L

## 2024-05-19 LAB — BETA-2 GLYCOPROTEIN ANTIBODIES
Beta-2 Glyco 1 IgA: <2 U/mL
Beta-2 Glyco 1 IgM: <2 U/mL
Beta-2 Glyco I IgG: <2 U/mL

## 2024-05-19 LAB — COMPREHENSIVE METABOLIC PANEL WITH GFR
AG Ratio: 1.8 (calc) (ref 1.0–2.5)
ALT: 22 U/L (ref 6–29)
AST: 18 U/L (ref 10–30)
Albumin: 4.8 g/dL (ref 3.6–5.1)
Alkaline phosphatase (APISO): 52 U/L (ref 31–125)
BUN: 11 mg/dL (ref 7–25)
CO2: 29 mmol/L (ref 20–32)
Calcium: 9.1 mg/dL (ref 8.6–10.2)
Chloride: 102 mmol/L (ref 98–110)
Creat: 0.61 mg/dL (ref 0.50–0.99)
Globulin: 2.6 g/dL (ref 1.9–3.7)
Glucose, Bld: 67 mg/dL (ref 65–99)
Potassium: 4 mmol/L (ref 3.5–5.3)
Sodium: 138 mmol/L (ref 135–146)
Total Bilirubin: 2.2 mg/dL — ABNORMAL HIGH (ref 0.2–1.2)
Total Protein: 7.4 g/dL (ref 6.1–8.1)
eGFR: 114 mL/min/{1.73_m2}

## 2024-05-19 LAB — ANTI-NUCLEAR AB-TITER (ANA TITER)
ANA TITER: 1:80 {titer} — ABNORMAL HIGH
ANA Titer 1: 1:40 {titer} — ABNORMAL HIGH

## 2024-05-19 LAB — CYCLIC CITRUL PEPTIDE ANTIBODY, IGG: Cyclic Citrullin Peptide Ab: <16 U

## 2024-05-19 LAB — SEDIMENTATION RATE: Sed Rate: 6 mm/h (ref 0–20)

## 2024-05-19 LAB — CARDIOLIPIN ANTIBODIES, IGG, IGM, IGA
Anticardiolipin IgA: <2 [APL'U]/mL
Anticardiolipin IgG: <2 [GPL'U]/mL
Anticardiolipin IgM: <2 [MPL'U]/mL

## 2024-05-19 LAB — RNP ANTIBODY: Ribonucleic Protein(ENA) Antibody, IgG: <1 AI

## 2024-05-19 LAB — ANTI-SCLERODERMA ANTIBODY: Scleroderma (Scl-70) (ENA) Antibody, IgG: <1 AI

## 2024-05-19 LAB — C3 AND C4
C3 Complement: 104 mg/dL (ref 83–193)
C4 Complement: 17 mg/dL (ref 15–57)

## 2024-05-19 LAB — ANTI-DNA ANTIBODY, DOUBLE-STRANDED: ds DNA Ab: <1 [IU]/mL

## 2024-05-19 LAB — MICROSCOPIC MESSAGE

## 2024-05-19 LAB — ANA: Anti Nuclear Antibody (ANA): POSITIVE — AB

## 2024-05-19 MED ORDER — HYDROXYCHLOROQUINE SULFATE 200 MG PO TABS: 200.0000 mg | ORAL_TABLET | Freq: Every day | ORAL | 2 refills | Status: AC

## 2024-05-20 NOTE — Progress Notes (Signed)
 Anti-CCP negative.

## 2024-07-01 ENCOUNTER — Ambulatory Visit: Admitting: Physician Assistant
